# Patient Record
Sex: Female | Born: 1950 | Race: White | Hispanic: No | Marital: Married | Smoking: Never smoker
Health system: Southern US, Community
[De-identification: ages and names within clinical notes are randomized; demographics above are authoritative.]

## PROBLEM LIST (undated history)

## (undated) DIAGNOSIS — E079 Disorder of thyroid, unspecified: Secondary | ICD-10-CM

## (undated) DIAGNOSIS — F419 Anxiety disorder, unspecified: Secondary | ICD-10-CM

## (undated) DIAGNOSIS — M1990 Unspecified osteoarthritis, unspecified site: Secondary | ICD-10-CM

## (undated) DIAGNOSIS — M5430 Sciatica, unspecified side: Secondary | ICD-10-CM

## (undated) HISTORY — PX: TONSILLECTOMY: SUR1361

---

## 2011-08-05 ENCOUNTER — Encounter (HOSPITAL_COMMUNITY): Payer: Self-pay | Admitting: *Deleted

## 2011-08-05 ENCOUNTER — Emergency Department (HOSPITAL_COMMUNITY)
Admission: EM | Admit: 2011-08-05 | Discharge: 2011-08-05 | Disposition: A | Discharge status: Home Health | Payer: BC Managed Care – PPO | Attending: Emergency Medicine | Admitting: Emergency Medicine

## 2011-08-05 ENCOUNTER — Emergency Department (HOSPITAL_COMMUNITY): Payer: BC Managed Care – PPO

## 2011-08-05 DIAGNOSIS — M129 Arthropathy, unspecified: Secondary | ICD-10-CM | POA: Insufficient documentation

## 2011-08-05 DIAGNOSIS — K5289 Other specified noninfective gastroenteritis and colitis: Secondary | ICD-10-CM | POA: Insufficient documentation

## 2011-08-05 DIAGNOSIS — K529 Noninfective gastroenteritis and colitis, unspecified: Secondary | ICD-10-CM

## 2011-08-05 DIAGNOSIS — R1084 Generalized abdominal pain: Secondary | ICD-10-CM | POA: Insufficient documentation

## 2011-08-05 DIAGNOSIS — R6883 Chills (without fever): Secondary | ICD-10-CM | POA: Insufficient documentation

## 2011-08-05 DIAGNOSIS — Z79899 Other long term (current) drug therapy: Secondary | ICD-10-CM | POA: Insufficient documentation

## 2011-08-05 DIAGNOSIS — R112 Nausea with vomiting, unspecified: Secondary | ICD-10-CM | POA: Insufficient documentation

## 2011-08-05 DIAGNOSIS — R197 Diarrhea, unspecified: Secondary | ICD-10-CM | POA: Insufficient documentation

## 2011-08-05 HISTORY — DX: Sciatica, unspecified side: M54.30

## 2011-08-05 HISTORY — DX: Unspecified osteoarthritis, unspecified site: M19.90

## 2011-08-05 HISTORY — DX: Disorder of thyroid, unspecified: E07.9

## 2011-08-05 HISTORY — DX: Anxiety disorder, unspecified: F41.9

## 2011-08-05 LAB — LIPASE, BLOOD: Lipase: 20 U/L (ref 11–59)

## 2011-08-05 LAB — DIFFERENTIAL
Basophils Absolute: 0 10*3/uL (ref 0.0–0.1)
Basophils Relative: 0 % (ref 0–1)
Eosinophils Absolute: 0 10*3/uL (ref 0.0–0.7)
Eosinophils Relative: 0 % (ref 0–5)
Monocytes Absolute: 0.2 10*3/uL (ref 0.1–1.0)

## 2011-08-05 LAB — URINALYSIS, ROUTINE W REFLEX MICROSCOPIC
Glucose, UA: NEGATIVE mg/dL
Ketones, ur: NEGATIVE mg/dL
Leukocytes, UA: NEGATIVE
Protein, ur: NEGATIVE mg/dL
Urobilinogen, UA: 0.2 mg/dL (ref 0.0–1.0)

## 2011-08-05 LAB — HEPATIC FUNCTION PANEL
ALT: 65 U/L — ABNORMAL HIGH (ref 0–35)
Bilirubin, Direct: 0.2 mg/dL (ref 0.0–0.3)
Indirect Bilirubin: 0.6 mg/dL (ref 0.3–0.9)

## 2011-08-05 LAB — BASIC METABOLIC PANEL
CO2: 24 mEq/L (ref 19–32)
Calcium: 8.9 mg/dL (ref 8.4–10.5)
Creatinine, Ser: 0.95 mg/dL (ref 0.50–1.10)
GFR calc Af Amer: 75 mL/min — ABNORMAL LOW (ref >90)
GFR calc non Af Amer: 64 mL/min — ABNORMAL LOW (ref >90)
Sodium: 138 mEq/L (ref 135–145)

## 2011-08-05 LAB — CBC
HCT: 42.2 % (ref 36.0–46.0)
MCH: 28.2 pg (ref 26.0–34.0)
MCHC: 34.1 g/dL (ref 30.0–36.0)
MCV: 82.7 fL (ref 78.0–100.0)
Platelets: 121 10*3/uL — ABNORMAL LOW (ref 150–400)
RDW: 14.9 % (ref 11.5–15.5)
WBC: 6.7 10*3/uL (ref 4.0–10.5)

## 2011-08-05 MED ORDER — ONDANSETRON HCL 4 MG/2ML IJ SOLN
4.0000 mg | Freq: Once | INTRAMUSCULAR | Status: AC
  Administered 2011-08-05: 4 mg via INTRAVENOUS
  Filled 2011-08-05: qty 2

## 2011-08-05 MED ORDER — HYDROMORPHONE HCL PF 1 MG/ML IJ SOLN
1.0000 mg | Freq: Once | INTRAMUSCULAR | Status: AC
  Administered 2011-08-05: 1 mg via INTRAVENOUS
  Filled 2011-08-05: qty 1

## 2011-08-05 MED ORDER — LOPERAMIDE HCL 2 MG PO CAPS: 2.0000 mg | ORAL_CAPSULE | Freq: Four times a day (QID) | ORAL | Status: AC | PRN

## 2011-08-05 MED ORDER — PROMETHAZINE HCL 25 MG PO TABS: 25.0000 mg | ORAL_TABLET | Freq: Four times a day (QID) | ORAL | Status: AC | PRN

## 2011-08-05 MED ORDER — SODIUM CHLORIDE 0.9 % IV SOLN
Freq: Once | INTRAVENOUS | Status: AC
  Administered 2011-08-05: 10:00:00 via INTRAVENOUS

## 2011-08-05 MED ORDER — IOHEXOL 300 MG/ML  SOLN
100.0000 mL | Freq: Once | INTRAMUSCULAR | Status: AC | PRN
  Administered 2011-08-05: 100 mL via INTRAVENOUS

## 2011-08-05 MED ORDER — ONDANSETRON 8 MG PO TBDP: 8.0000 mg | ORAL_TABLET | Freq: Three times a day (TID) | ORAL | Status: AC | PRN

## 2011-08-05 MED ORDER — SODIUM CHLORIDE 0.9 % IV BOLUS (SEPSIS)
1000.0000 mL | Freq: Once | INTRAVENOUS | Status: AC
  Administered 2011-08-05: 1000 mL via INTRAVENOUS

## 2011-08-05 NOTE — ED Provider Notes (Signed)
History    Scribed for Shelda Jakes, MD, the patient was seen in room APA04/APA04. This chart was scribed by Katha Cabal.   CSN: [id number]  Arrival date & time 08/05/11  6962   First MD Initiated Contact with Patient 08/05/11 [phone]      Chief Complaint  Patient presents with  . Abdominal Pain  . Diarrhea    (Consider location/radiation/quality/duration/timing/severity/associated sxs/prior treatment) HPI  Pt was seen at 9:38 AM   Angelica Wong is a 61 y.o. female who presents to the Emergency Department complaining of constant generalized abdominal pain that began yesterday.  Pain is described as cramping.  Pain rated 6/10 currently.   Symptoms are associated with chills, nausea and vomiting and malodorous diarrhea.  No blood in diarrhea or vomit.  There has been no change in symptoms.  Patient reports sick contacts at her job.  Denies new swelling in legs or back pain, dysuria, sore throat, chest pain, shortness of breath or rash.     PCP Dr. Malena Peer [place], [place])      Past Medical History  Diagnosis Date  . Thyroid disease   . Arthritis   . Sciatica   . Anxiety     Past Surgical History  Procedure Date  . Cesarean section   . Tonsillectomy     No family history on file.  History  Substance Use Topics  . Smoking status: Never Smoker   . Smokeless tobacco: Not on file  . Alcohol Use: No    OB History    Grav Para Term Preterm Abortions TAB SAB Ect Mult Living                  Review of Systems  All other systems reviewed and are negative.   Remaining review of systems negative except as noted in the HPI.   Allergies  Wellbutrin  Home Medications   Current Outpatient Rx  Name Route Sig Dispense Refill  . ALPRAZOLAM 1 MG PO TABS Oral Take 0.5 mg by mouth daily as needed. Sleep    . VITAMIN B-12 PO Oral Take 1 tablet by mouth daily.    . DULOXETINE HCL 60 MG PO CPEP Oral Take 60 mg by mouth daily.    Marland Kitchen LEVOTHYROXINE SODIUM 137  MCG PO TABS Oral Take 137 mcg by mouth daily.    . ADULT MULTIVITAMIN W/MINERALS CH Oral Take 1 tablet by mouth daily.    Marland Kitchen VITAMIN D (ERGOCALCIFEROL) 50000 UNITS PO CAPS Oral Take 50,000 Units by mouth every 7 (seven) days. Fridays    . LOPERAMIDE HCL 2 MG PO CAPS Oral Take 1 capsule (2 mg total) by mouth 4 (four) times daily as needed for diarrhea or loose stools. 12 capsule 0  . ONDANSETRON 8 MG PO TBDP Oral Take 1 tablet (8 mg total) by mouth every 8 (eight) hours as needed for nausea. 20 tablet 0  . PROMETHAZINE HCL 25 MG PO TABS Oral Take 1 tablet (25 mg total) by mouth every 6 (six) hours as needed for nausea. 12 tablet 0    BP 120/60  Pulse 94  Temp(Src) 99.3 F (37.4 C) (Oral)  Resp 20  Ht 5\' 5"  (1.651 m)  Wt 270 lb (122.471 kg)  BMI 44.93 kg/m2  SpO2 97%  Physical Exam  Nursing note and vitals reviewed. Constitutional: She is oriented to person, place, and time. She appears well-developed.  HENT:  Head: Normocephalic and atraumatic.  Mouth/Throat: Oropharynx is clear and moist and  mucous membranes are normal. No posterior oropharyngeal erythema.  Eyes: Conjunctivae and EOM are normal.  Neck: Neck supple.  Cardiovascular: Normal rate, regular rhythm and normal heart sounds.   Pulmonary/Chest: Effort normal and breath sounds normal. No respiratory distress. She has no wheezes.  Abdominal: Soft. Bowel sounds are normal. There is no tenderness. There is no rebound and no guarding.  Musculoskeletal: Normal range of motion. She exhibits no edema.  Lymphadenopathy:    She has no cervical adenopathy.  Neurological: She is alert and oriented to person, place, and time. No sensory deficit.  Skin: Skin is warm and dry. No rash noted.  Psychiatric: She has a normal mood and affect. Her behavior is normal.    ED Course  Procedures (including critical care time)   DIAGNOSTIC STUDIES: Oxygen Saturation is 100% on room air, normal by my interpretation.     COORDINATION OF  CARE:   9:23 AM  Labs and UA.     9:30 AM  Zofran and IV fluids.    9:41 AM  Physical exam complete.  Will order labs and hepatic function panel.     9:51 AM  RN reported to EDMD that patient requested something for pain.  Pain control.  10:00 AM  Dilaudid ordered.  11:00 AM Zofran ordered.  2:12 PM  Advised patient of radiological and lab findings.  Plan to discharge patient.  Patient agrees with plan.    LABS / RADIOLOGY:   Labs Reviewed  CBC - Abnormal; Notable for the following:    Platelets 121 (*)    All other components within normal limits  BASIC METABOLIC PANEL - Abnormal; Notable for the following:    Potassium 3.3 (*)    GFR calc non Af Amer 64 (*)    GFR calc Af Amer 75 (*)    All other components within normal limits  HEPATIC FUNCTION PANEL - Abnormal; Notable for the following:    Albumin 3.4 (*)    AST 70 (*)    ALT 65 (*)    Alkaline Phosphatase 159 (*)    All other components within normal limits  DIFFERENTIAL  URINALYSIS, ROUTINE W REFLEX MICROSCOPIC  LIPASE, BLOOD  LIPASE, BLOOD  HEPATIC FUNCTION PANEL   Results for orders placed during the hospital encounter of 08/05/11  CBC      Component Value Range   WBC 6.7  4.0 - 10.5 (K/uL)   RBC 5.10  3.87 - 5.11 (MIL/uL)   Hemoglobin 14.4  12.0 - 15.0 (g/dL)   HCT 09.8  11.9 - 14.7 (%)   MCV 82.7  78.0 - 100.0 (fL)   MCH 28.2  26.0 - 34.0 (pg)   MCHC 34.1  30.0 - 36.0 (g/dL)   RDW 82.9  56.2 - 13.0 (%)   Platelets 121 (*) 150 - 400 (K/uL)  DIFFERENTIAL      Component Value Range   Neutrophils Relative 76  43 - 77 (%)   Neutro Abs 5.1  1.7 - 7.7 (K/uL)   Lymphocytes Relative 20  12 - 46 (%)   Lymphs Abs 1.3  0.7 - 4.0 (K/uL)   Monocytes Relative 3  3 - 12 (%)   Monocytes Absolute 0.2  0.1 - 1.0 (K/uL)   Eosinophils Relative 0  0 - 5 (%)   Eosinophils Absolute 0.0  0.0 - 0.7 (K/uL)   Basophils Relative 0  0 - 1 (%)   Basophils Absolute 0.0  0.0 - 0.1 (K/uL)  BASIC METABOLIC PANEL  Component Value  Range   Sodium 138  135 - 145 (mEq/L)   Potassium 3.3 (*) 3.5 - 5.1 (mEq/L)   Chloride 104  96 - 112 (mEq/L)   CO2 24  19 - 32 (mEq/L)   Glucose, Bld 96  70 - 99 (mg/dL)   BUN 14  6 - 23 (mg/dL)   Creatinine, Ser 1.61  0.50 - 1.10 (mg/dL)   Calcium 8.9  8.4 - 09.6 (mg/dL)   GFR calc non Af Amer 64 (*) >90 (mL/min)   GFR calc Af Amer 75 (*) >90 (mL/min)  URINALYSIS, ROUTINE W REFLEX MICROSCOPIC      Component Value Range   Color, Urine YELLOW  YELLOW    APPearance CLEAR  CLEAR    Specific Gravity, Urine 1.025  1.005 - 1.030    pH 6.0  5.0 - 8.0    Glucose, UA NEGATIVE  NEGATIVE (mg/dL)   Hgb urine dipstick NEGATIVE  NEGATIVE    Bilirubin Urine NEGATIVE  NEGATIVE    Ketones, ur NEGATIVE  NEGATIVE (mg/dL)   Protein, ur NEGATIVE  NEGATIVE (mg/dL)   Urobilinogen, UA 0.2  0.0 - 1.0 (mg/dL)   Nitrite NEGATIVE  NEGATIVE    Leukocytes, UA NEGATIVE  NEGATIVE   HEPATIC FUNCTION PANEL      Component Value Range   Total Protein 7.2  6.0 - 8.3 (g/dL)   Albumin 3.4 (*) 3.5 - 5.2 (g/dL)   AST 70 (*) 0 - 37 (U/L)   ALT 65 (*) 0 - 35 (U/L)   Alkaline Phosphatase 159 (*) 39 - 117 (U/L)   Total Bilirubin 0.8  0.3 - 1.2 (mg/dL)   Bilirubin, Direct 0.2  0.0 - 0.3 (mg/dL)   Indirect Bilirubin 0.6  0.3 - 0.9 (mg/dL)  LIPASE, BLOOD      Component Value Range   Lipase 20  11 - 59 (U/L)    Ct Abdomen Pelvis W Contrast  08/05/2011  *RADIOLOGY REPORT*  Clinical Data: Abdominal pain and diarrhea.  CT ABDOMEN AND PELVIS WITH CONTRAST  Technique:  Multidetector CT imaging of the abdomen and pelvis was performed following the standard protocol during bolus administration of intravenous contrast.  Contrast: OMNIPAQUE IOHEXOL 300 MG/ML IJ SOLN  Comparison: None.  Findings: Lung bases are clear.  Heart size normal.  Small pre pericardiac lymph nodes.  No pericardial or pleural effusion.  Liver is unremarkable.  A 2.1 cm stone is seen in the gallbladder. Adrenal glands are unremarkable.  Tiny stone in the  right kidney. No obstruction.  Low attenuation lesions in the kidneys measure up to 2.4 cm on the right and are likely cysts.  Spleen measures 13.5 cm.  Pancreas, stomach and small bowel are unremarkable.  Fluid attenuation is seen in the majority of the colon.  Uterus and ovaries are visualized.  No pathologically enlarged lymph nodes.  No free fluid.  Small amount of air seen in the bladder is presumably iatrogenic.  Mild mesenteric haziness and sub centimeter nodularity, nonspecific.  No worrisome lytic or sclerotic lesions.  IMPRESSION:  1.  Fluid attenuation within the colon is consistent with the given history of diarrhea. 2.  Cholelithiasis. 3.  Tiny right renal stone. 4.  Borderline splenic enlargement. 5.  Air in the bladder is presumably iatrogenic in etiology.  Original Report Authenticated By: Reyes Ivan, M.D.         MDM  Clinically symptoms consistent with viral gastroenteritis CT scan was ordered because of diffuse abdominal pain and  cramping however CT without any specific findings. Patient improved in the emergency department no further vomiting or diarrhea is persisting. Abdomen is now soft nontender. Patient overall feeling better.       MEDICATIONS GIVEN IN THE E.D. Scheduled Meds:    . sodium chloride   Intravenous Once  . HYDROmorphone  1 mg Intravenous Once  . ondansetron  4 mg Intravenous Once  . ondansetron  4 mg Intravenous Once  . sodium chloride  1,000 mL Intravenous Once   Continuous Infusions:      IMPRESSION: 1. Gastroenteritis      NEW MEDICATIONS: New Prescriptions   LOPERAMIDE (IMODIUM) 2 MG CAPSULE    Take 1 capsule (2 mg total) by mouth 4 (four) times daily as needed for diarrhea or loose stools.   ONDANSETRON (ZOFRAN ODT) 8 MG DISINTEGRATING TABLET    Take 1 tablet (8 mg total) by mouth every 8 (eight) hours as needed for nausea.   PROMETHAZINE (PHENERGAN) 25 MG TABLET    Take 1 tablet (25 mg total) by mouth every 6 (six) hours as  needed for nausea.      I personally performed the services described in this documentation, which was scribed in my presence. The recorded information has been reviewed and considered.        Shelda Jakes, MD 08/05/11 [phone]

## 2011-08-05 NOTE — Discharge Instructions (Signed)
B.R.A.T. Diet Your doctor has recommended the B.R.A.T. diet for you or your child until the condition improves. This is often used to help control diarrhea and vomiting symptoms. If you or your child can tolerate clear liquids, you may have:  Bananas.   Rice.   Applesauce.   Toast (and other simple starches such as crackers, potatoes, noodles).  Be sure to avoid dairy products, meats, and fatty foods until symptoms are better. Fruit juices such as apple, grape, and prune juice can make diarrhea worse. Avoid these. Continue this diet for 2 days or as instructed by your caregiver. Document Released: 05/17/2005 Document Revised: 05/06/2011 Document Reviewed: 11/03/2006 [facility] Patient Information 2012 [place], [place].  Take medicines as directed return for new or worse symptoms or for persistent vomiting and diarrhea or worse abdominal pain.

## 2011-08-05 NOTE — ED Notes (Signed)
Pt states abdominal pain, nausea and vomiting since yesterday. Last vomited at 0030.

## 2016-06-23 ENCOUNTER — Other Ambulatory Visit (HOSPITAL_COMMUNITY): Payer: Self-pay | Admitting: Neurological Surgery

## 2016-06-23 DIAGNOSIS — M5412 Radiculopathy, cervical region: Secondary | ICD-10-CM

## 2016-06-28 ENCOUNTER — Encounter (HOSPITAL_COMMUNITY): Payer: Self-pay

## 2016-06-28 ENCOUNTER — Ambulatory Visit (HOSPITAL_COMMUNITY): Payer: Medicare HMO

## 2016-07-12 ENCOUNTER — Ambulatory Visit (HOSPITAL_COMMUNITY)
Admission: RE | Admit: 2016-07-12 | Discharge: 2016-07-12 | Disposition: A | Discharge status: Home / Self Care | Payer: Medicare HMO | Source: Ambulatory Visit | Attending: Neurological Surgery | Admitting: Neurological Surgery

## 2016-07-12 DIAGNOSIS — M4802 Spinal stenosis, cervical region: Secondary | ICD-10-CM | POA: Diagnosis not present

## 2016-07-12 DIAGNOSIS — M50321 Other cervical disc degeneration at C4-C5 level: Secondary | ICD-10-CM | POA: Insufficient documentation

## 2016-07-12 DIAGNOSIS — M5412 Radiculopathy, cervical region: Secondary | ICD-10-CM | POA: Diagnosis present

## 2017-06-21 ENCOUNTER — Emergency Department (HOSPITAL_COMMUNITY): Payer: Medicare Other

## 2017-06-21 ENCOUNTER — Encounter (HOSPITAL_COMMUNITY): Payer: Self-pay | Admitting: Emergency Medicine

## 2017-06-21 ENCOUNTER — Other Ambulatory Visit: Payer: Self-pay

## 2017-06-21 ENCOUNTER — Emergency Department (HOSPITAL_COMMUNITY)
Admission: EM | Admit: 2017-06-21 | Discharge: 2017-06-21 | Disposition: A | Discharge status: Home / Self Care | Payer: Medicare Other | Attending: Emergency Medicine | Admitting: Emergency Medicine

## 2017-06-21 DIAGNOSIS — K5900 Constipation, unspecified: Secondary | ICD-10-CM | POA: Diagnosis present

## 2017-06-21 DIAGNOSIS — R140 Abdominal distension (gaseous): Secondary | ICD-10-CM | POA: Diagnosis not present

## 2017-06-21 DIAGNOSIS — Z79899 Other long term (current) drug therapy: Secondary | ICD-10-CM | POA: Diagnosis not present

## 2017-06-21 MED ORDER — SIMETHICONE 80 MG PO CHEW: 80.0000 mg | CHEWABLE_TABLET | Freq: Four times a day (QID) | ORAL | 0 refills | Status: AC | PRN

## 2017-06-21 NOTE — ED Provider Notes (Signed)
[place] [facility]   CSN: [id number] Arrival date & time: 06/21/17  [id number]     History   Chief Complaint Chief Complaint  Patient presents with  . Constipation    HPI Angelica Wong is a 67 y.o. female.  HPI She presents with 2 weeks of decreased bowel movements and abdominal distention.  She is been using over-the-counter medication for presumed constipation.  States she is passing gas.  No nausea or vomiting.  No fever or chills.  Denies any urinary symptoms.  Complains of dry mouth.  States she has had problems with constipation on and off for most of her life.  No recent dietary changes. Past Medical History:  Diagnosis Date  . Anxiety   . Arthritis   . Sciatica   . Thyroid disease     There are no active problems to display for this patient.   Past Surgical History:  Procedure Laterality Date  . CESAREAN SECTION    . TONSILLECTOMY      OB History    Gravida Para Term Preterm AB Living   3             SAB TAB Ectopic Multiple Live Births                   Home Medications    Prior to Admission medications   Medication Sig Start Date End Date Taking? Authorizing Provider  cyclobenzaprine (FLEXERIL) 5 MG tablet Take 5 mg by mouth 2 (two) times daily.   Yes [provider]  DULoxetine (CYMBALTA) 60 MG capsule Take 60 mg by mouth daily.   Yes [provider]  folic acid (FOLVITE) 1 MG tablet Take 1 mg by mouth daily.   Yes [provider]  Levothyroxine Sodium 112 MCG CAPS Take 112 mcg by mouth daily.    Yes [provider]  LORazepam (ATIVAN) 0.5 MG tablet Take 0.5 mg by mouth 2 (two) times daily. 2 tablets at bedtime   Yes [provider]  traMADol (ULTRAM) 50 MG tablet Take by mouth every 12 (twelve) hours as needed.   Yes [provider]  Vitamin D, Ergocalciferol, (DRISDOL) 50000 UNITS CAPS Take 50,000 Units by mouth every 7 (seven) days. Fridays   Yes [provider]  simethicone (GAS-X) 80 MG chewable tablet Chew 1 tablet (80 mg total) by mouth every 6 (six) hours as needed for flatulence. 06/21/17   Loren RacerYelverton, Tishina Lown, MD    Family History History reviewed. No pertinent family history.  Social History Social History   Tobacco Use  . Smoking status: Never Smoker  . Smokeless tobacco: Never Used  Substance Use Topics  . Alcohol use: No  . Drug use: Not on file     Allergies   Wellbutrin [bupropion hcl]   Review of Systems Review of Systems  Constitutional: Negative for chills and fever.  Respiratory: Negative for cough and shortness of breath.   Cardiovascular: Negative for chest pain.  Gastrointestinal: Positive for abdominal distention and constipation. Negative for abdominal pain, diarrhea, nausea and vomiting.  Genitourinary: Negative for dysuria, flank pain and frequency.  Musculoskeletal: Negative for back pain, myalgias and neck pain.  Skin: Negative for rash and wound.  Neurological: Negative for dizziness, weakness, light-headedness, numbness and headaches.  All other systems reviewed and are negative.    Physical Exam Updated Vital Signs BP 135/72   Pulse 69   Temp 98.4 F (36.9 C) (Oral)   Resp 16  Ht 5\' 5"  (1.651 m)   Wt 113.4 kg (250 lb)   SpO2 100%   BMI 41.60 kg/m   Physical Exam  Constitutional: She is oriented to person, place, and time. She appears well-developed and well-nourished.  HENT:  Head: Normocephalic and atraumatic.  Mouth/Throat: Oropharynx is clear and moist.  Eyes: EOM are normal. Pupils are equal, round, and reactive to light.  Neck: Normal range of motion. Neck supple.  Cardiovascular: Normal rate and regular rhythm.  Pulmonary/Chest: Effort normal and breath sounds normal.  Abdominal: Soft. Bowel sounds are normal. She exhibits distension. She exhibits no mass. There is no tenderness. There is no rebound and no guarding. No hernia.  Question mild abdominal distention.  Normal bowel  sounds.  No tenderness to palpation.  Musculoskeletal: Normal range of motion. She exhibits no edema or tenderness.  No CVA tenderness bilaterally.  No lower extremity swelling or pain.  Distal pulses are 2+.  Neurological: She is alert and oriented to person, place, and time.  Skin: Skin is warm and dry. No rash noted. No erythema.  Psychiatric: She has a normal mood and affect. Her behavior is normal.  Nursing note and vitals reviewed.    ED Treatments / Results  Labs (all labs ordered are listed, but only abnormal results are displayed) Labs Reviewed - No data to display  EKG  EKG Interpretation None       Radiology Dg Abd Acute W/chest  Result Date: 06/21/2017 CLINICAL DATA:  Abdominal pain, no bowel movements for 2 weeks EXAM: DG ABDOMEN ACUTE W/ 1V CHEST COMPARISON:  08/05/2011 CT of the abdomen FINDINGS: Cardiac shadow is within normal limits. Aortic calcifications are seen. The lungs are well aerated bilaterally. No focal infiltrate or sizable effusion is noted. Lamellated gallstone is noted in the right upper quadrant. Scattered large and small bowel gas is seen. No free air is noted. Degenerative changes of lumbar spine are seen. No other focal abnormality is noted. IMPRESSION: Cholelithiasis without definitive complicating factors. No acute abnormality noted. Electronically Signed   By: Alcide Clever M.D.   On: 06/21/2017 10:21    Procedures Procedures (including critical care time)  Medications Ordered in ED Medications - No data to display   Initial Impression / Assessment and Plan / ED Course  I have reviewed the triage vital signs and the nursing notes.  Pertinent labs & imaging results that were available during my care of the patient were reviewed by me and considered in my medical decision making (see chart for details).     X-ray with scattered air.  Does not appear to have increased stool burden.  Abdominal exam is benign.  Do not believe that further  workup is needed at this time.  Will start on simethicone on and have advised gastroneurology follow-up.  Strict return precautions have been given.  Final Clinical Impressions(s) / ED Diagnoses   Final diagnoses:  Gaseous abdominal distention    ED Discharge Orders        Ordered    simethicone (GAS-X) 80 MG chewable tablet  Every 6 hours PRN     06/21/17 1153       Loren Racer, MD 06/21/17 1200

## 2017-06-21 NOTE — ED Triage Notes (Signed)
Last normal about 2 weeks ago.  Tried OTC treatment with little results.  Fleets enema 2 days ago with little results.  Pt says she may have thrush in mouth also.  Having lower back pain, rating pain 5/10.

## 2020-04-07 ENCOUNTER — Encounter (HOSPITAL_COMMUNITY): Payer: Self-pay

## 2020-04-07 ENCOUNTER — Emergency Department (HOSPITAL_COMMUNITY)
Admission: EM | Admit: 2020-04-07 | Discharge: 2020-04-07 | Disposition: A | Discharge status: Home / Self Care | Payer: Medicare Other | Attending: Emergency Medicine | Admitting: Emergency Medicine

## 2020-04-07 ENCOUNTER — Emergency Department (HOSPITAL_COMMUNITY): Payer: Medicare Other

## 2020-04-07 ENCOUNTER — Other Ambulatory Visit: Payer: Self-pay

## 2020-04-07 DIAGNOSIS — Z79899 Other long term (current) drug therapy: Secondary | ICD-10-CM | POA: Insufficient documentation

## 2020-04-07 DIAGNOSIS — R059 Cough, unspecified: Secondary | ICD-10-CM | POA: Diagnosis present

## 2020-04-07 DIAGNOSIS — U071 COVID-19: Secondary | ICD-10-CM

## 2020-04-07 NOTE — ED Triage Notes (Signed)
Pt reports tested positive for covid Saturday.  C/O sob, cough, chest pain with coughing, chills, abd pain, and diarrhea.

## 2020-04-07 NOTE — Discharge Instructions (Signed)
The monoclonal antibody clinic is aware that you wish for monoclonal antibody infusion.  They should call you in the next 1 to 2 days to set up the infusion.  Today your oxygen was good in the emergency room.  If you get short of breath, your symptoms change or worsen or you have any new concerns please seek additional medical care and evaluation.  I would recommend getting a pulse oximeter to check your oxygen at home.  If your oxygen is consistently below 92% or you have other concerns please seek additional medical care and evaluation.  Please take Tylenol (acetaminophen) to relieve your pain.  You may take tylenol, up to 1,000 mg (two extra strength pills).  Do not take more than 3,000 mg tylenol in a 24 hour period.  Please check all medication labels as many medications such as pain and cold medications may contain tylenol. Please do not drink alcohol while taking this medication.

## 2020-04-07 NOTE — ED Provider Notes (Signed)
[facility] EMERGENCY DEPARTMENT Provider Note   CSN: [id number] Arrival date & time: 04/07/20  1706     History Chief Complaint  Patient presents with  . covid  . Shortness of Breath    Angelica Wong is a 69 y.o. female with past medical history of anxiety, thyroid disease, BMI over 41, who presents today for evaluation of cough.  She is on day 5 of COVID-19 symptoms.  She reports that her primary concern is the cough which she is able to provoke when she takes a full deep breath.  She denies any significant shortness of breath.  She has not been vaccinated.  She wishes for monoclonal antibody infusion.  She states that her husband is sick with similar.  She had a positive test at a local drugstore.  She denies any leg swelling.  No vomiting.  Does report occasional diarrhea.  HPI     Past Medical History:  Diagnosis Date  . Anxiety   . Arthritis   . Sciatica   . Thyroid disease     There are no problems to display for this patient.   Past Surgical History:  Procedure Laterality Date  . CESAREAN SECTION    . TONSILLECTOMY       OB History    Gravida  3   Para      Term      Preterm      AB      Living        SAB      TAB      Ectopic      Multiple      Live Births              No family history on file.  Social History   Tobacco Use  . Smoking status: Never Smoker  . Smokeless tobacco: Never Used  Substance Use Topics  . Alcohol use: No  . Drug use: Not on file    Home Medications Prior to Admission medications   Medication Sig Start Date End Date Taking? Authorizing Provider  albuterol (VENTOLIN HFA) 108 (90 Base) MCG/ACT inhaler  02/16/20  Yes [provider]  allopurinol (ZYLOPRIM) 100 MG tablet Take 100 mg by mouth daily. 10/31/19  Yes [provider]  ascorbic acid (VITAMIN C) 1000 MG tablet Take by mouth. 11/09/17  Yes [provider]  cyclobenzaprine (FLEXERIL) 5 MG tablet Take 5 mg by mouth 2 (two)  times daily.   Yes [provider]  DULoxetine (CYMBALTA) 60 MG capsule Take 60 mg by mouth daily.   Yes [provider]  folic acid (FOLVITE) 1 MG tablet Take 1 mg by mouth daily.   Yes [provider]  Levothyroxine Sodium 112 MCG CAPS Take 112 mcg by mouth daily.    Yes [provider]  LORazepam (ATIVAN) 0.5 MG tablet Take 0.5 mg by mouth 2 (two) times daily. 2 tablets at bedtime   Yes [provider]  montelukast (SINGULAIR) 10 MG tablet Take 10 mg by mouth daily. 01/29/20  Yes [provider]  Multiple Vitamin (MULTIVITAMIN) tablet Take 1 tablet by mouth daily.   Yes [provider]  simethicone (GAS-X) 80 MG chewable tablet Chew 1 tablet (80 mg total) by mouth every 6 (six) hours as needed for flatulence. 06/21/17  Yes Loren Racer, MD  traMADol (ULTRAM) 50 MG tablet Take by mouth every 12 (twelve) hours as needed.   Yes [provider]  Vitamin D, Ergocalciferol, (  DRISDOL) 50000 UNITS CAPS Take 50,000 Units by mouth every 7 (seven) days. Fridays   Yes [provider]  azithromycin (ZITHROMAX) 250 MG tablet Take 250 mg by mouth as directed. Patient not taking: Reported on 04/07/2020 03/13/20   [provider]  metoprolol succinate (TOPROL-XL) 25 MG 24 hr tablet Take 25 mg by mouth daily. 01/22/20   [provider]  predniSONE (DELTASONE) 20 MG tablet Take 20 mg by mouth 2 (two) times daily. Patient not taking: Reported on 04/07/2020 03/13/20   [provider]    Allergies    Wellbutrin [bupropion hcl]  Review of Systems   Review of Systems  Constitutional: Positive for chills, fatigue and fever.  Respiratory: Positive for cough. Negative for shortness of breath.   Cardiovascular: Positive for chest pain.  Gastrointestinal: Positive for diarrhea. Negative for abdominal pain and vomiting.  Musculoskeletal: Positive for arthralgias and myalgias.  Skin: Negative for color change  and rash.  Neurological: Negative for dizziness, seizures, weakness and numbness.  All other systems reviewed and are negative.   Physical Exam Updated Vital Signs BP 105/63 (BP Location: Left Arm)   Pulse 76   Temp 99.3 F (37.4 C) (Oral)   Resp 20   Ht 5\' 4"  (1.626 m)   Wt 122.5 kg   SpO2 97%   BMI 46.35 kg/m   Physical Exam Vitals and nursing note reviewed.  Constitutional:      General: She is not in acute distress.    Appearance: She is obese. She is not ill-appearing.  HENT:     Head: Normocephalic.  Eyes:     Conjunctiva/sclera: Conjunctivae normal.  Cardiovascular:     Rate and Rhythm: Normal rate and regular rhythm.  Pulmonary:     Effort: Pulmonary effort is normal. No tachypnea, accessory muscle usage or respiratory distress.     Breath sounds: No decreased breath sounds.  Abdominal:     Palpations: Abdomen is soft.     Tenderness: There is no abdominal tenderness.  Musculoskeletal:     Cervical back: Normal range of motion and neck supple.     Comments: Able to stand and walk in place with out difficulty.   Skin:    General: Skin is warm.  Neurological:     General: No focal deficit present.     Mental Status: She is alert.     Comments: Awake and alert, answers all questions appropriately.  Speech is not slurred.  Psychiatric:        Mood and Affect: Mood normal.        Behavior: Behavior normal.     ED Results / Procedures / Treatments   Labs (all labs ordered are listed, but only abnormal results are displayed) Labs Reviewed - No data to display  EKG EKG Interpretation  Date/Time:  Monday April 07 2020 17:56:13 EST Ventricular Rate:  70 PR Interval:  166 QRS Duration: 80 QT Interval:  390 QTC Calculation: 421 R Axis:   17 Text Interpretation: Normal sinus rhythm Inferior infarct , age undetermined Cannot rule out Anterior infarct , age undetermined Abnormal ECG No old tracing to compare Confirmed by 04-28-1985 [phone]) on 04/07/2020  6:16:41 PM   Radiology DG Chest Portable 1 View  Result Date: 04/07/2020 CLINICAL DATA:  Cough shortness of breath COVID positive EXAM: PORTABLE CHEST 1 VIEW COMPARISON:  None. FINDINGS: The heart size and mediastinal contours are within normal limits. Hazy patchy airspace opacity seen within the right mid lung and bilateral  lung bases. Aortic calcifications are seen. The visualized skeletal structures are unremarkable. IMPRESSION: Patchy airspace opacity seen within both lungs which be due to infectious etiology. Electronically Signed   By: Jonna Clark M.D.   On: 04/07/2020 18:46    Procedures Procedures (including critical care time)  Medications Ordered in ED Medications - No data to display  ED Course  I have reviewed the triage vital signs and the nursing notes.  Pertinent labs & imaging results that were available during my care of the patient were reviewed by me and considered in my medical decision making (see chart for details).    MDM Rules/Calculators/A&P                          Kylena Mole is a 69 year old woman who is on day 5 of COVID-19 symptoms.  She presents today primarily concern for cough.  She notes that every time she takes a full deep breath she then coughs.  She did not get vaccinated.  She wishes for monoclonal antibody infusion.  She had a positive test at outside facility.  Chest x-ray today shows patchy airspace opacity bilaterally consistent with COVID-19 viral pneumonia.  I personally ambulated her in the room and she maintained 98% and above on room air without significant shortness of breath.  I contacted the monoclonal antibody infusion group, they are aware that patient has Covid and wishes for infusion and will contact her.  Due to her only being on day 5 of symptoms with adequate time to get this treatment as an outpatient she will be discharged for this. We discussed anticipated course of Covid.  Return precautions were discussed with patient  who states their understanding.  At the time of discharge patient denied any unaddressed complaints or concerns.  Patient is agreeable for discharge home.  Note: Portions of this report may have been transcribed using voice recognition software. Every effort was made to ensure accuracy; however, inadvertent computerized transcription errors may be present   Angelica Wong was evaluated in Emergency Department on 04/07/2020 for the symptoms described in the history of present illness. She was evaluated in the context of the global COVID-19 pandemic, which necessitated consideration that the patient might be at risk for infection with the SARS-CoV-2 virus that causes COVID-19. Institutional protocols and algorithms that pertain to the evaluation of patients at risk for COVID-19 are in a state of rapid change based on information released by regulatory bodies including the CDC and federal and state organizations. These policies and algorithms were followed during the patient's care in the ED.  Final Clinical Impression(s) / ED Diagnoses Final diagnoses:  COVID-19  Cough    Rx / DC Orders ED Discharge Orders    None       Yoselin, Amerman 04/07/20 1956    Mancel Bale, MD 04/08/20 [phone]

## 2020-04-08 ENCOUNTER — Other Ambulatory Visit: Payer: Self-pay | Admitting: Nurse Practitioner

## 2020-04-08 ENCOUNTER — Ambulatory Visit (HOSPITAL_COMMUNITY)
Admission: RE | Admit: 2020-04-08 | Discharge: 2020-04-08 | Disposition: A | Discharge status: Home / Self Care | Payer: Medicare Other | Source: Ambulatory Visit | Attending: Pulmonary Disease | Admitting: Pulmonary Disease

## 2020-04-08 DIAGNOSIS — U071 COVID-19: Secondary | ICD-10-CM | POA: Diagnosis not present

## 2020-04-08 MED ORDER — SODIUM CHLORIDE 0.9 % IV SOLN: INTRAVENOUS | Status: DC | PRN

## 2020-04-08 MED ORDER — EPINEPHRINE 0.3 MG/0.3ML IJ SOAJ: 0.3000 mg | Freq: Once | INTRAMUSCULAR | Status: DC | PRN

## 2020-04-08 MED ORDER — ALBUTEROL SULFATE HFA 108 (90 BASE) MCG/ACT IN AERS: 2.0000 | INHALATION_SPRAY | Freq: Once | RESPIRATORY_TRACT | Status: DC | PRN

## 2020-04-08 MED ORDER — FAMOTIDINE IN NACL 20-0.9 MG/50ML-% IV SOLN: 20.0000 mg | Freq: Once | INTRAVENOUS | Status: DC | PRN

## 2020-04-08 MED ORDER — DIPHENHYDRAMINE HCL 50 MG/ML IJ SOLN: 50.0000 mg | Freq: Once | INTRAMUSCULAR | Status: DC | PRN

## 2020-04-08 MED ORDER — METHYLPREDNISOLONE SODIUM SUCC 125 MG IJ SOLR: 125.0000 mg | Freq: Once | INTRAMUSCULAR | Status: DC | PRN

## 2020-04-08 MED ORDER — SOTROVIMAB 500 MG/8ML IV SOLN
500.0000 mg | Freq: Once | INTRAVENOUS | Status: AC
  Administered 2020-04-08: 500 mg via INTRAVENOUS

## 2020-04-08 NOTE — Discharge Instructions (Signed)
10 Things You Can Do to Manage Your COVID-19 Symptoms at Home °If you have possible or confirmed COVID-19: °1. Stay home from work and school. And stay away from other public places. If you must go out, avoid using any kind of public transportation, ridesharing, or taxis. °2. Monitor your symptoms carefully. If your symptoms get worse, call your healthcare provider immediately. °3. Get rest and stay hydrated. °4. If you have a medical appointment, call the healthcare provider ahead of time and tell them that you have or may have COVID-19. °5. For medical emergencies, call 911 and notify the dispatch personnel that you have or may have COVID-19. °6. Cover your cough and sneezes with a tissue or use the inside of your elbow. °7. Wash your hands often with soap and water for at least 20 seconds or clean your hands with an alcohol-based hand sanitizer that contains at least 60% alcohol. °8. As much as possible, stay in a specific room and away from other people in your home. Also, you should use a separate bathroom, if available. If you need to be around other people in or outside of the home, wear a mask. °9. Avoid sharing personal items with other people in your household, like dishes, towels, and bedding. °10. Clean all surfaces that are touched often, like counters, tabletops, and doorknobs. Use household cleaning sprays or wipes according to the label instructions. °What types of side effects do monoclonal antibody drugs cause?  °Common side effects ° °In general, the more common side effects caused by monoclonal antibody drugs include: °• Allergic reactions, such as hives or itching °• Flu-like signs and symptoms, including chills, fatigue, fever, and muscle aches and pains °• Nausea, vomiting °• Diarrhea °• Skin rashes °• Low blood pressure ° ° °The CDC is recommending patients who receive monoclonal antibody treatments wait at least 90 days before being vaccinated. ° °Currently, there are no data on the safety  and efficacy of mRNA COVID-19 vaccines in persons who received monoclonal antibodies or convalescent plasma as part of COVID-19 treatment. Based on the estimated half-life of such therapies as well as evidence suggesting that reinfection is uncommon in the 90 days after initial infection, vaccination should be deferred for at least 90 days, as a precautionary measure until additional information becomes available, to avoid interference of the antibody treatment with vaccine-induced immune responses. °cdc.gov/coronavirus °11/29/2018 °This information is not intended to replace advice given to you by your health care provider. Make sure you discuss any questions you have with your health care provider. °Document Revised: 05/03/2019 Document Reviewed: 05/03/2019 °Elsevier Patient Education © 2020 Elsevier Inc. ° °

## 2020-04-08 NOTE — Progress Notes (Signed)
I connected by phone with Angelica Wong on 04/08/2020 at 11:32 AM to discuss the potential use of a new treatment for mild to moderate COVID-19 viral infection in non-hospitalized patients.  This patient is a 69 y.o. female that meets the FDA criteria for Emergency Use Authorization of COVID monoclonal antibody casirivimab/imdevimab, bamlanivimab/eteseviamb, or sotrovimab.  Has a (+) direct SARS-CoV-2 viral test result  Has mild or moderate COVID-19   Is NOT hospitalized due to COVID-19  Is within 10 days of symptom onset  Has at least one of the high risk factor(s) for progression to severe COVID-19 and/or hospitalization as defined in EUA.  Specific high risk criteria : BMI > 25   I have spoken and communicated the following to the patient or parent/caregiver regarding COVID monoclonal antibody treatment:  1. FDA has authorized the emergency use for the treatment of mild to moderate COVID-19 in adults and pediatric patients with positive results of direct SARS-CoV-2 viral testing who are 87 years of age and older weighing at least 40 kg, and who are at high risk for progressing to severe COVID-19 and/or hospitalization.  2. The significant known and potential risks and benefits of COVID monoclonal antibody, and the extent to which such potential risks and benefits are unknown.  3. Information on available alternative treatments and the risks and benefits of those alternatives, including clinical trials.  4. Patients treated with COVID monoclonal antibody should continue to self-isolate and use infection control measures (e.g., wear mask, isolate, social distance, avoid sharing personal items, clean and disinfect "high touch" surfaces, and frequent handwashing) according to CDC guidelines.   5. The patient or parent/caregiver has the option to accept or refuse COVID monoclonal antibody treatment.  After reviewing this information with the patient, the patient has agreed to receive one  of the available covid 19 monoclonal antibodies and will be provided an appropriate fact sheet prior to infusion. Angelica Andrew, NP 04/08/2020 11:32 AM

## 2020-04-12 ENCOUNTER — Encounter (HOSPITAL_COMMUNITY): Payer: Self-pay | Admitting: Emergency Medicine

## 2020-04-12 ENCOUNTER — Other Ambulatory Visit: Payer: Self-pay

## 2020-04-12 ENCOUNTER — Inpatient Hospital Stay (HOSPITAL_COMMUNITY)
Admission: EM | Admit: 2020-04-12 | Discharge: 2020-04-24 | DRG: 177 | Disposition: A | Discharge status: Home Health | Payer: Medicare Other | Attending: Internal Medicine | Admitting: Internal Medicine

## 2020-04-12 ENCOUNTER — Emergency Department (HOSPITAL_COMMUNITY): Payer: Medicare Other

## 2020-04-12 ENCOUNTER — Inpatient Hospital Stay (HOSPITAL_COMMUNITY): Payer: Medicare Other

## 2020-04-12 DIAGNOSIS — R739 Hyperglycemia, unspecified: Secondary | ICD-10-CM | POA: Diagnosis not present

## 2020-04-12 DIAGNOSIS — I10 Essential (primary) hypertension: Secondary | ICD-10-CM | POA: Diagnosis present

## 2020-04-12 DIAGNOSIS — R339 Retention of urine, unspecified: Secondary | ICD-10-CM | POA: Diagnosis not present

## 2020-04-12 DIAGNOSIS — D696 Thrombocytopenia, unspecified: Secondary | ICD-10-CM | POA: Diagnosis present

## 2020-04-12 DIAGNOSIS — Z888 Allergy status to other drugs, medicaments and biological substances status: Secondary | ICD-10-CM | POA: Diagnosis not present

## 2020-04-12 DIAGNOSIS — U071 COVID-19: Secondary | ICD-10-CM | POA: Diagnosis present

## 2020-04-12 DIAGNOSIS — Z6841 Body Mass Index (BMI) 40.0 and over, adult: Secondary | ICD-10-CM

## 2020-04-12 DIAGNOSIS — N179 Acute kidney failure, unspecified: Secondary | ICD-10-CM | POA: Diagnosis not present

## 2020-04-12 DIAGNOSIS — Z79890 Hormone replacement therapy (postmenopausal): Secondary | ICD-10-CM

## 2020-04-12 DIAGNOSIS — T380X5A Adverse effect of glucocorticoids and synthetic analogues, initial encounter: Secondary | ICD-10-CM | POA: Diagnosis not present

## 2020-04-12 DIAGNOSIS — Z7189 Other specified counseling: Secondary | ICD-10-CM | POA: Diagnosis not present

## 2020-04-12 DIAGNOSIS — Z515 Encounter for palliative care: Secondary | ICD-10-CM

## 2020-04-12 DIAGNOSIS — R0902 Hypoxemia: Secondary | ICD-10-CM

## 2020-04-12 DIAGNOSIS — R7989 Other specified abnormal findings of blood chemistry: Secondary | ICD-10-CM

## 2020-04-12 DIAGNOSIS — Z79899 Other long term (current) drug therapy: Secondary | ICD-10-CM

## 2020-04-12 DIAGNOSIS — J9601 Acute respiratory failure with hypoxia: Secondary | ICD-10-CM | POA: Diagnosis present

## 2020-04-12 DIAGNOSIS — F32A Depression, unspecified: Secondary | ICD-10-CM | POA: Diagnosis present

## 2020-04-12 DIAGNOSIS — M1990 Unspecified osteoarthritis, unspecified site: Secondary | ICD-10-CM | POA: Diagnosis present

## 2020-04-12 DIAGNOSIS — J189 Pneumonia, unspecified organism: Secondary | ICD-10-CM | POA: Diagnosis present

## 2020-04-12 DIAGNOSIS — J1282 Pneumonia due to coronavirus disease 2019: Secondary | ICD-10-CM | POA: Diagnosis present

## 2020-04-12 DIAGNOSIS — F419 Anxiety disorder, unspecified: Secondary | ICD-10-CM | POA: Diagnosis present

## 2020-04-12 DIAGNOSIS — R0602 Shortness of breath: Secondary | ICD-10-CM | POA: Diagnosis present

## 2020-04-12 DIAGNOSIS — E039 Hypothyroidism, unspecified: Secondary | ICD-10-CM | POA: Diagnosis present

## 2020-04-12 LAB — CBC WITH DIFFERENTIAL/PLATELET
Abs Immature Granulocytes: 0.01 10*3/uL (ref 0.00–0.07)
Basophils Absolute: 0 10*3/uL (ref 0.0–0.1)
Basophils Relative: 0 %
Eosinophils Absolute: 0 10*3/uL (ref 0.0–0.5)
Eosinophils Relative: 0 %
HCT: 41.9 % (ref 36.0–46.0)
Hemoglobin: 13.8 g/dL (ref 12.0–15.0)
Immature Granulocytes: 0 %
Lymphocytes Relative: 25 %
Lymphs Abs: 0.8 10*3/uL (ref 0.7–4.0)
MCH: 29.2 pg (ref 26.0–34.0)
MCHC: 32.9 g/dL (ref 30.0–36.0)
MCV: 88.6 fL (ref 80.0–100.0)
Monocytes Absolute: 0.4 10*3/uL (ref 0.1–1.0)
Monocytes Relative: 12 %
Neutro Abs: 1.9 10*3/uL (ref 1.7–7.7)
Neutrophils Relative %: 63 %
Platelets: 53 10*3/uL — ABNORMAL LOW (ref 150–400)
RBC: 4.73 MIL/uL (ref 3.87–5.11)
RDW: 15.6 % — ABNORMAL HIGH (ref 11.5–15.5)
WBC: 3.1 10*3/uL — ABNORMAL LOW (ref 4.0–10.5)
nRBC: 0 % (ref 0.0–0.2)

## 2020-04-12 LAB — LACTIC ACID, PLASMA
Lactic Acid, Venous: 1.5 mmol/L (ref 0.5–1.9)
Lactic Acid, Venous: 2.6 mmol/L (ref 0.5–1.9)

## 2020-04-12 LAB — COMPREHENSIVE METABOLIC PANEL
ALT: 95 U/L — ABNORMAL HIGH (ref 0–44)
AST: 246 U/L — ABNORMAL HIGH (ref 15–41)
Albumin: 2.9 g/dL — ABNORMAL LOW (ref 3.5–5.0)
Alkaline Phosphatase: 368 U/L — ABNORMAL HIGH (ref 38–126)
Anion gap: 7 (ref 5–15)
BUN: 17 mg/dL (ref 8–23)
CO2: 25 mmol/L (ref 22–32)
Calcium: 7.9 mg/dL — ABNORMAL LOW (ref 8.9–10.3)
Chloride: 105 mmol/L (ref 98–111)
Creatinine, Ser: 1.03 mg/dL — ABNORMAL HIGH (ref 0.44–1.00)
GFR, Estimated: 60 mL/min — ABNORMAL LOW (ref >60)
Glucose, Bld: 107 mg/dL — ABNORMAL HIGH (ref 70–99)
Potassium: 4.3 mmol/L (ref 3.5–5.1)
Sodium: 137 mmol/L (ref 135–145)
Total Bilirubin: 1.7 mg/dL — ABNORMAL HIGH (ref 0.3–1.2)
Total Protein: 6.3 g/dL — ABNORMAL LOW (ref 6.5–8.1)

## 2020-04-12 LAB — FERRITIN: Ferritin: 1272 ng/mL — ABNORMAL HIGH (ref 11–307)

## 2020-04-12 LAB — PROCALCITONIN
Procalcitonin: 0.44 ng/mL
Procalcitonin: 0.48 ng/mL

## 2020-04-12 LAB — TYPE AND SCREEN
ABO/RH(D): B POS
Antibody Screen: NEGATIVE

## 2020-04-12 LAB — LACTATE DEHYDROGENASE: LDH: 725 U/L — ABNORMAL HIGH (ref 98–192)

## 2020-04-12 LAB — C-REACTIVE PROTEIN: CRP: 5.9 mg/dL — ABNORMAL HIGH (ref <1.0)

## 2020-04-12 LAB — D-DIMER, QUANTITATIVE: D-Dimer, Quant: 1.71 ug/mL-FEU — ABNORMAL HIGH (ref 0.00–0.50)

## 2020-04-12 LAB — FIBRINOGEN: Fibrinogen: 262 mg/dL (ref 210–475)

## 2020-04-12 LAB — PROTIME-INR
INR: 0.9 (ref 0.8–1.2)
Prothrombin Time: 12.1 seconds (ref 11.4–15.2)

## 2020-04-12 MED ORDER — SODIUM CHLORIDE 0.9 % IV SOLN
100.0000 mg | INTRAVENOUS | Status: AC
  Administered 2020-04-12 (×2): 100 mg via INTRAVENOUS
  Filled 2020-04-12 (×2): qty 20

## 2020-04-12 MED ORDER — GUAIFENESIN-DM 100-10 MG/5ML PO SYRP
10.0000 mL | ORAL_SOLUTION | ORAL | Status: DC | PRN
  Filled 2020-04-12: qty 10

## 2020-04-12 MED ORDER — ONDANSETRON HCL 4 MG PO TABS: 4.0000 mg | ORAL_TABLET | Freq: Four times a day (QID) | ORAL | Status: DC | PRN

## 2020-04-12 MED ORDER — LEVOTHYROXINE SODIUM 112 MCG PO CAPS
112.0000 ug | ORAL_CAPSULE | Freq: Every day | ORAL | Status: DC
  Administered 2020-04-13 – 2020-04-14 (×2): 112 ug via ORAL
  Filled 2020-04-12 (×4): qty 1

## 2020-04-12 MED ORDER — LORAZEPAM 0.5 MG PO TABS
0.5000 mg | ORAL_TABLET | Freq: Two times a day (BID) | ORAL | Status: DC | PRN
  Administered 2020-04-12 – 2020-04-15 (×6): 0.5 mg via ORAL
  Filled 2020-04-12 (×6): qty 1

## 2020-04-12 MED ORDER — PREDNISONE 20 MG PO TABS
50.0000 mg | ORAL_TABLET | Freq: Every day | ORAL | Status: DC
  Administered 2020-04-15 – 2020-04-16 (×2): 50 mg via ORAL
  Filled 2020-04-12 (×2): qty 1

## 2020-04-12 MED ORDER — METOPROLOL SUCCINATE ER 25 MG PO TB24
25.0000 mg | ORAL_TABLET | Freq: Every day | ORAL | Status: DC
  Administered 2020-04-12 – 2020-04-24 (×9): 25 mg via ORAL
  Filled 2020-04-12 (×13): qty 1

## 2020-04-12 MED ORDER — TRAMADOL HCL 50 MG PO TABS
50.0000 mg | ORAL_TABLET | Freq: Two times a day (BID) | ORAL | Status: DC | PRN
  Administered 2020-04-12 – 2020-04-24 (×16): 50 mg via ORAL
  Filled 2020-04-12 (×18): qty 1

## 2020-04-12 MED ORDER — SODIUM CHLORIDE 0.9 % IV SOLN
100.0000 mg | Freq: Every day | INTRAVENOUS | Status: AC
  Administered 2020-04-13 – 2020-04-16 (×4): 100 mg via INTRAVENOUS
  Filled 2020-04-12 (×4): qty 20

## 2020-04-12 MED ORDER — DULOXETINE HCL 60 MG PO CPEP
60.0000 mg | ORAL_CAPSULE | Freq: Every day | ORAL | Status: DC
  Administered 2020-04-12 – 2020-04-24 (×13): 60 mg via ORAL
  Filled 2020-04-12 (×4): qty 1
  Filled 2020-04-12: qty 2
  Filled 2020-04-12 (×7): qty 1
  Filled 2020-04-12: qty 2

## 2020-04-12 MED ORDER — CYCLOBENZAPRINE HCL 10 MG PO TABS: 5.0000 mg | ORAL_TABLET | Freq: Two times a day (BID) | ORAL | Status: DC | PRN

## 2020-04-12 MED ORDER — SODIUM CHLORIDE 0.9 % IV SOLN: 100.0000 mg | Freq: Every day | INTRAVENOUS | Status: DC

## 2020-04-12 MED ORDER — HYDROCOD POLST-CPM POLST ER 10-8 MG/5ML PO SUER
5.0000 mL | Freq: Two times a day (BID) | ORAL | Status: DC | PRN
  Administered 2020-04-14 – 2020-04-19 (×3): 5 mL via ORAL
  Filled 2020-04-12 (×3): qty 5

## 2020-04-12 MED ORDER — METHYLPREDNISOLONE SODIUM SUCC 125 MG IJ SOLR
1.0000 mg/kg | Freq: Two times a day (BID) | INTRAMUSCULAR | Status: AC
  Administered 2020-04-12 – 2020-04-14 (×6): 122.5 mg via INTRAVENOUS
  Filled 2020-04-12 (×6): qty 2

## 2020-04-12 MED ORDER — SODIUM CHLORIDE 0.9 % IV SOLN: 200.0000 mg | Freq: Once | INTRAVENOUS | Status: DC

## 2020-04-12 MED ORDER — ACETAMINOPHEN 325 MG PO TABS
650.0000 mg | ORAL_TABLET | Freq: Four times a day (QID) | ORAL | Status: DC | PRN
  Administered 2020-04-16: 650 mg via ORAL
  Filled 2020-04-12 (×2): qty 2

## 2020-04-12 MED ORDER — ONDANSETRON HCL 4 MG/2ML IJ SOLN: 4.0000 mg | Freq: Four times a day (QID) | INTRAMUSCULAR | Status: DC | PRN

## 2020-04-12 MED ORDER — ALBUTEROL SULFATE HFA 108 (90 BASE) MCG/ACT IN AERS
2.0000 | INHALATION_SPRAY | Freq: Four times a day (QID) | RESPIRATORY_TRACT | Status: DC
  Administered 2020-04-12 – 2020-04-18 (×26): 2 via RESPIRATORY_TRACT
  Filled 2020-04-12 (×2): qty 6.7

## 2020-04-12 MED ORDER — MONTELUKAST SODIUM 10 MG PO TABS
10.0000 mg | ORAL_TABLET | Freq: Every day | ORAL | Status: DC
  Administered 2020-04-12 – 2020-04-23 (×12): 10 mg via ORAL
  Filled 2020-04-12 (×12): qty 1

## 2020-04-12 NOTE — ED Notes (Signed)
Pt presenting anxious and hypoxic during rounding. Pt O2 Sats in 70s and 80s on 5L Nasal Cannula at this time. Pt placed on 10L HFNC and O2 Sats improved to 96%. Pt status improved and reports being more comfortable at this time.

## 2020-04-12 NOTE — ED Provider Notes (Signed)
[facility] EMERGENCY DEPARTMENT Provider Note   CSN: [id number] Arrival date & time: 04/12/20  9562   History Chief Complaint  Patient presents with  . Shortness of Breath    Angelica Wong is a 69 y.o. female.  The history is provided by the patient.  Shortness of Breath She was diagnosed with COVID-19 3 days ago and comes in because of worsening shortness of breath.  She has been sick for about 10 days with fever, chills, nonproductive cough.  She has lost her sense of smell and taste.  She did receive monoclonal antibody infusion, but has had problems with diarrhea since then.  Dyspnea has become significantly worse over the last 24hours.  Of note, she is not vaccinated for COVID-19.  Past Medical History:  Diagnosis Date  . Anxiety   . Arthritis   . Sciatica   . Thyroid disease     There are no problems to display for this patient.   Past Surgical History:  Procedure Laterality Date  . CESAREAN SECTION    . TONSILLECTOMY       OB History    Gravida  3   Para      Term      Preterm      AB      Living        SAB      TAB      Ectopic      Multiple      Live Births              History reviewed. No pertinent family history.  Social History   Tobacco Use  . Smoking status: Never Smoker  . Smokeless tobacco: Never Used  Vaping Use  . Vaping Use: Never used  Substance Use Topics  . Alcohol use: No  . Drug use: Never    Home Medications Prior to Admission medications   Medication Sig Start Date End Date Taking? Authorizing Provider  albuterol (VENTOLIN HFA) 108 (90 Base) MCG/ACT inhaler  02/16/20   [provider]  allopurinol (ZYLOPRIM) 100 MG tablet Take 100 mg by mouth daily. 10/31/19   [provider]  ascorbic acid (VITAMIN C) 1000 MG tablet Take by mouth. 11/09/17   [provider]  azithromycin (ZITHROMAX) 250 MG tablet Take 250 mg by mouth as directed. Patient not taking: Reported on 04/07/2020  03/13/20   [provider]  cyclobenzaprine (FLEXERIL) 5 MG tablet Take 5 mg by mouth 2 (two) times daily.    [provider]  DULoxetine (CYMBALTA) 60 MG capsule Take 60 mg by mouth daily.    [provider]  folic acid (FOLVITE) 1 MG tablet Take 1 mg by mouth daily.    [provider]  Levothyroxine Sodium 112 MCG CAPS Take 112 mcg by mouth daily.     [provider]  LORazepam (ATIVAN) 0.5 MG tablet Take 0.5 mg by mouth 2 (two) times daily. 2 tablets at bedtime    [provider]  metoprolol succinate (TOPROL-XL) 25 MG 24 hr tablet Take 25 mg by mouth daily. 01/22/20   [provider]  montelukast (SINGULAIR) 10 MG tablet Take 10 mg by mouth daily. 01/29/20   [provider]  Multiple Vitamin (MULTIVITAMIN) tablet Take 1 tablet by mouth daily.    [provider]  predniSONE (DELTASONE) 20 MG tablet Take 20 mg by mouth 2 (two) times daily. Patient not taking: Reported on 04/07/2020 03/13/20   [provider]  simethicone (GAS-X) 80 MG chewable tablet Chew 1 tablet (80 mg total) by mouth every 6 (six) hours as needed for flatulence. 06/21/17   Loren Racer, MD  traMADol (ULTRAM) 50 MG tablet Take by mouth every 12 (twelve) hours as needed.    [provider]  Vitamin D, Ergocalciferol, (DRISDOL) 50000 UNITS CAPS Take 50,000 Units by mouth every 7 (seven) days. Fridays    [provider]    Allergies    Wellbutrin [bupropion hcl]  Review of Systems   Review of Systems  Respiratory: Positive for shortness of breath.   All other systems reviewed and are negative.   Physical Exam Updated Vital Signs BP (!) 142/85 (BP Location: Left Arm)   Pulse 68   Temp 98.6 F (37 C) (Oral)   Resp (!) 22   Ht 5\' 4"  (1.626 m)   Wt 122.5 kg   SpO2 (!) 79%   BMI 46.35 kg/m   Physical Exam Vitals and nursing note reviewed.   69 year old female, appears mildly dyspneic at rest, but is in no  acute distress. Vital signs are significant for elevated respiratory rate and borderline elevated blood pressure. Oxygen saturation is 79%, which is hypoxic. Head is normocephalic and atraumatic. PERRLA, EOMI. Oropharynx is clear. Neck is nontender and supple without adenopathy or JVD. Back is nontender and there is no CVA tenderness. Lungs have bibasilar rales which are more prominent on the right.  There are no wheezes or rhonchi. Chest is nontender. Heart has regular rate and rhythm without murmur. Abdomen is soft, flat, nontender without masses or hepatosplenomegaly and peristalsis is normoactive. Extremities have 1+ edema, full range of motion is present. Skin is warm and dry without rash. Neurologic: Mental status is normal, cranial nerves are intact, there are no motor or sensory deficits.  ED Results / Procedures / Treatments   Labs (all labs ordered are listed, but only abnormal results are displayed) Labs Reviewed  COMPREHENSIVE METABOLIC PANEL - Abnormal; Notable for the following components:      Result Value   Glucose, Bld 107 (*)    Creatinine, Ser 1.03 (*)    Calcium 7.9 (*)    Total Protein 6.3 (*)    Albumin 2.9 (*)    AST 246 (*)    ALT 95 (*)    Alkaline Phosphatase 368 (*)    Total Bilirubin 1.7 (*)    GFR, Estimated 60 (*)    All other components within normal limits  FIBRINOGEN  CBC WITH DIFFERENTIAL/PLATELET  PROTIME-INR  LACTIC ACID, PLASMA  C-REACTIVE PROTEIN  D-DIMER, QUANTITATIVE (NOT AT [facility])  FERRITIN  LACTATE DEHYDROGENASE  PROCALCITONIN  LACTIC ACID, PLASMA  TYPE AND SCREEN    EKG EKG Interpretation  Date/Time:  Saturday April 12 2020 06:26:16 EST Ventricular Rate:  66 PR Interval:    QRS Duration: 89 QT Interval:  440 QTC Calculation: 461 R Axis:   -3 Text Interpretation: Sinus rhythm Normal ECG When compared with ECG of 04/07/2020, No significant change was found Confirmed by 13/12/2019 (Dione Booze) on 04/12/2020 7:23:28  AM   Radiology DG Chest Port 1 View  Result Date: 04/12/2020 CLINICAL DATA:  COVID positive. EXAM: PORTABLE CHEST 1 VIEW COMPARISON:  04/07/2020 FINDINGS: 0649 hours. Lungs are hyperexpanded. Interval progression of patchy peripheral ground-glass airspace opacity compatible with multifocal pneumonia. The cardio pericardial silhouette is enlarged. The visualized bony structures of the thorax show no acute abnormality. Telemetry leads overlie the chest. IMPRESSION: Progression of peripherally predominant patchy ground-glass  attenuation consistent with multifocal pneumonia. Electronically Signed   By: Kennith Center M.D.   On: 04/12/2020 07:26    Procedures Procedures   Medications Ordered in ED Medications - No data to display  ED Course  I have reviewed the triage vital signs and the nursing notes.  Pertinent labs & imaging results that were available during my care of the patient were reviewed by me and considered in my medical decision making (see chart for details).  MDM Rules/Calculators/A&P COVID-19 infection with progressive dyspnea and hypoxia.  Old records are reviewed confirming recent ED visit with positive test for COVID-19, and outpatient administration of monoclonal antibodies.  With supplemental oxygen, oxygen saturation has increased to 97-100%, but she continues to be dyspneic in spite of normal oxygen saturation.  She will clearly need to be admitted.  ECG is normal.  Chest x-ray shows slight progression of interstitial densities consistent with Covid pneumonia.  Case is discussed with Dr. Kerry Hough of [facility], who agrees to admit the patient.  Metabolic panel shows elevation of alkaline phosphatase, transaminases, bilirubin.  Fibrinogen is normal.  Philomena Course was evaluated in Emergency Department on 04/12/2020 for the symptoms described in the history of present illness. She was evaluated in the context of the global COVID-19 pandemic, which necessitated  consideration that the patient might be at risk for infection with the SARS-CoV-2 virus that causes COVID-19. Institutional protocols and algorithms that pertain to the evaluation of patients at risk for COVID-19 are in a state of rapid change based on information released by regulatory bodies including the CDC and federal and state organizations. These policies and algorithms were followed during the patient's care in the ED.  Final Clinical Impression(s) / ED Diagnoses Final diagnoses:  Pneumonia due to COVID-19 virus  Hypoxia    Rx / DC Orders ED Discharge Orders    None       Dione Booze, MD 04/13/20 [phone]

## 2020-04-12 NOTE — ED Triage Notes (Signed)
Pt tested COVID+ on 04/07/20, c/o SOB x 3 days worsening since last night; diarrhea uncontrolled and left ear pain

## 2020-04-12 NOTE — ED Notes (Signed)
Attending in room °

## 2020-04-12 NOTE — H&P (Signed)
History and Physical    Angelica Wong GBE:[id number] DOB: 09-10-1950 DOA: 04/12/2020  PCP: Lenoria Chime, FNP  Patient coming from: home  I have personally briefly reviewed patient's old medical records in [facility]  Chief Complaint: shortness of breath  HPI: Angelica Wong is a 69 y.o. female with medical history significant of hypertension, hypothyroidism, morbid obesity, was recently diagnosed with COVID-19 approximately 10 days ago.  She was having fever, cough, malaise.  She was seen on Monday in the emergency room and at that time she was not hypoxic.  She received a monoclonal antibody infusion the following day on 11/9.  She reports over the past 3 days she has had worsening shortness of breath.  She is also had frequent diarrhea, abdominal cramping and nausea.  She has not had any vomiting.  She continues to have fevers.  Her husband also has COVID-19, but overall is doing better than she is.  She is unvaccinated.  ED Course: On arrival to the emergency room, she is noted to be hypoxic on room air with O2 saturations in the 70s.  Chest x-ray confirms COVID-19 pneumonia.  Inflammatory markers are elevated.  She does have a platelet count in the 50s.  LFTs are elevated.  Review of Systems:  Review of Systems  Constitutional: Positive for fever and malaise/fatigue.  HENT: Positive for sore throat. Negative for congestion.   Eyes: Negative for blurred vision and double vision.  Respiratory: Positive for cough, sputum production and shortness of breath.   Cardiovascular: Negative for chest pain and leg swelling.  Gastrointestinal: Positive for abdominal pain, diarrhea and nausea. Negative for blood in stool, melena and vomiting.  Genitourinary: Negative for dysuria.  Musculoskeletal: Positive for myalgias.  Neurological: Negative for dizziness and loss of consciousness.  Psychiatric/Behavioral: The patient is nervous/anxious.       Past Medical History:  Diagnosis  Date  . Anxiety   . Arthritis   . Sciatica   . Thyroid disease     Past Surgical History:  Procedure Laterality Date  . CESAREAN SECTION    . TONSILLECTOMY      Social History:  reports that she has never smoked. She has never used smokeless tobacco. She reports that she does not drink alcohol and does not use drugs.  Allergies  Allergen Reactions  . Wellbutrin [Bupropion Hcl] Other (See Comments)    Uncontrollable Crying    Family history: Family history reviewed and not pertinent  Prior to Admission medications   Medication Sig Start Date End Date Taking? Authorizing Provider  albuterol (VENTOLIN HFA) 108 (90 Base) MCG/ACT inhaler  02/16/20   [provider]  allopurinol (ZYLOPRIM) 100 MG tablet Take 100 mg by mouth daily. 10/31/19   [provider]  ascorbic acid (VITAMIN C) 1000 MG tablet Take by mouth. 11/09/17   [provider]  azithromycin (ZITHROMAX) 250 MG tablet Take 250 mg by mouth as directed. Patient not taking: Reported on 04/07/2020 03/13/20   [provider]  cyclobenzaprine (FLEXERIL) 5 MG tablet Take 5 mg by mouth 2 (two) times daily.    [provider]  DULoxetine (CYMBALTA) 60 MG capsule Take 60 mg by mouth daily.    [provider]  folic acid (FOLVITE) 1 MG tablet Take 1 mg by mouth daily.    [provider]  Levothyroxine Sodium 112 MCG CAPS Take 112 mcg by mouth daily.     [provider]  LORazepam (ATIVAN) 0.5 MG tablet Take 0.5 mg  by mouth 2 (two) times daily. 2 tablets at bedtime    [provider]  metoprolol succinate (TOPROL-XL) 25 MG 24 hr tablet Take 25 mg by mouth daily. 01/22/20   [provider]  montelukast (SINGULAIR) 10 MG tablet Take 10 mg by mouth daily. 01/29/20   [provider]  Multiple Vitamin (MULTIVITAMIN) tablet Take 1 tablet by mouth daily.    [provider]  predniSONE (DELTASONE) 20 MG tablet Take 20 mg by mouth 2 (two) times  daily. Patient not taking: Reported on 04/07/2020 03/13/20   [provider]  simethicone (GAS-X) 80 MG chewable tablet Chew 1 tablet (80 mg total) by mouth every 6 (six) hours as needed for flatulence. 06/21/17   Loren Racer, MD  traMADol (ULTRAM) 50 MG tablet Take by mouth every 12 (twelve) hours as needed.    [provider]  Vitamin D, Ergocalciferol, (DRISDOL) 50000 UNITS CAPS Take 50,000 Units by mouth every 7 (seven) days. Fridays    [provider]    Physical Exam: Vitals:   04/12/20 0745 04/12/20 0800 04/12/20 0815 04/12/20 0830  BP:  132/88  137/89  Pulse: 66 66    Resp: (!) 22 20 (!) 27 (!) 25  Temp:      TempSrc:      SpO2: 100% 100%    Weight:      Height:        Constitutional: NAD, calm, comfortable Eyes: PERRL, lids and conjunctivae normal ENMT: Mucous membranes are moist. Posterior pharynx clear of any exudate or lesions.Normal dentition.  Neck: normal, supple, no masses, no thyromegaly Respiratory: clear to auscultation bilaterally, no wheezing, no crackles. Normal respiratory effort. No accessory muscle use.  Cardiovascular: Regular rate and rhythm, no murmurs / rubs / gallops. No extremity edema. 2+ pedal pulses. No carotid bruits.  Abdomen: no tenderness, no masses palpated. No hepatosplenomegaly. Bowel sounds positive.  Musculoskeletal: no clubbing / cyanosis. No joint deformity upper and lower extremities. Good ROM, no contractures. Normal muscle tone.  Skin: no rashes, lesions, ulcers. No induration Neurologic: CN 2-12 grossly intact. Sensation intact, DTR normal. Strength 5/5 in all 4.  Psychiatric: Normal judgment and insight. Alert and oriented x 3. Normal mood.    Labs on Admission: I have personally reviewed following labs and imaging studies  CBC: Recent Labs  Lab 04/12/20 0640  WBC 3.1*  NEUTROABS 1.9  HGB 13.8  HCT 41.9  MCV 88.6  PLT 53*   Basic Metabolic Panel: Recent Labs  Lab 04/12/20 0640  NA 137  K  4.3  CL 105  CO2 25  GLUCOSE 107*  BUN 17  CREATININE 1.03*  CALCIUM 7.9*   GFR: Estimated Creatinine Clearance: 68.4 mL/min (A) (by C-G formula based on SCr of 1.03 mg/dL (H)). Liver Function Tests: Recent Labs  Lab 04/12/20 0640  AST 246*  ALT 95*  ALKPHOS 368*  BILITOT 1.7*  PROT 6.3*  ALBUMIN 2.9*   No results for input(s): LIPASE, AMYLASE in the last 168 hours. No results for input(s): AMMONIA in the last 168 hours. Coagulation Profile: Recent Labs  Lab 04/12/20 0640  INR 0.9   Cardiac Enzymes: No results for input(s): CKTOTAL, CKMB, CKMBINDEX, TROPONINI in the last 168 hours. BNP (last 3 results) No results for input(s): PROBNP in the last 8760 hours. HbA1C: No results for input(s): HGBA1C in the last 72 hours. CBG: No results for input(s): GLUCAP in the last 168 hours. Lipid Profile: No results for input(s): CHOL, HDL, LDLCALC, TRIG,  CHOLHDL, LDLDIRECT in the last 72 hours. Thyroid Function Tests: No results for input(s): TSH, T4TOTAL, FREET4, T3FREE, THYROIDAB in the last 72 hours. Anemia Panel: Recent Labs    04/12/20 0655  FERRITIN 1,272*   Urine analysis:    Component Value Date/Time   COLORURINE YELLOW 08/05/2011 1107   APPEARANCEUR CLEAR 08/05/2011 1107   LABSPEC 1.025 08/05/2011 1107   PHURINE 6.0 08/05/2011 1107   GLUCOSEU NEGATIVE 08/05/2011 1107   HGBUR NEGATIVE 08/05/2011 1107   BILIRUBINUR NEGATIVE 08/05/2011 1107   KETONESUR NEGATIVE 08/05/2011 1107   PROTEINUR NEGATIVE 08/05/2011 1107   UROBILINOGEN 0.2 08/05/2011 1107   NITRITE NEGATIVE 08/05/2011 1107   LEUKOCYTESUR NEGATIVE 08/05/2011 1107    Radiological Exams on Admission: DG Chest Port 1 View  Result Date: 04/12/2020 CLINICAL DATA:  COVID positive. EXAM: PORTABLE CHEST 1 VIEW COMPARISON:  04/07/2020 FINDINGS: 0649 hours. Lungs are hyperexpanded. Interval progression of patchy peripheral ground-glass airspace opacity compatible with multifocal pneumonia. The cardio  pericardial silhouette is enlarged. The visualized bony structures of the thorax show no acute abnormality. Telemetry leads overlie the chest. IMPRESSION: Progression of peripherally predominant patchy ground-glass attenuation consistent with multifocal pneumonia. Electronically Signed   By: Kennith Center M.D.   On: 04/12/2020 07:26    EKG: Independently reviewed. Sinus rhythm without acute changes  Assessment/Plan Active Problems:   Pneumonia due to COVID-19 virus   Acute respiratory failure with hypoxia (HCC)   Thrombocytopenia (HCC)   Obesity, Class III, BMI 40-49.9 (morbid obesity) (HCC)   Essential hypertension   Hypothyroidism   Elevated LFTs     Acute respiratory failure with hypoxia -Secondary to COVID-19 pneumonia -Currently on 2 L of oxygen -Wean off oxygen as tolerated  COVID-19 pneumonia -Patient is unvaccinated -Inflammatory markers are elevated -Check procalcitonin -Start remdesivir -Continue to follow LFTs and if they trend up, will need to discontinue remdesivir -Start IV steroids -She recently received a monoclonal antibody infusion on 11/9  Elevated liver enzymes -She does report being diagnosed with fatty liver in the past -She does not have any abdominal pain at this time -Check viral hepatitis panel -Check right upper quadrant ultrasound  Thrombocytopenia -Patient reports is a chronic issue for which she has been evaluated hematology clinic at [facility] -She is unaware what her baseline platelet counts run -We will request records from [place] -Thrombocytopenia may be exacerbated by underlying viral process -No signs of bleeding at this time -Continue to monitor  Hypothyroidism -Continue on Synthroid  Morbid obesity -Increases risk of morbidity/mortality with COVID-19  Hypertension -Continue metoprolol  DVT prophylaxis: SCDs Code Status: full code  Family Communication: updated husband over the phone  Disposition Plan: discharge  home once respiratory status has stabilized  Consults called:   Admission status: inpatient, medsurg   Erick Blinks MD [facility]   If 7PM-7AM, please contact night-coverage [email or web address]   04/12/2020, 9:25 AM

## 2020-04-13 DIAGNOSIS — J9601 Acute respiratory failure with hypoxia: Secondary | ICD-10-CM | POA: Diagnosis not present

## 2020-04-13 DIAGNOSIS — R7989 Other specified abnormal findings of blood chemistry: Secondary | ICD-10-CM | POA: Diagnosis not present

## 2020-04-13 DIAGNOSIS — I10 Essential (primary) hypertension: Secondary | ICD-10-CM | POA: Diagnosis not present

## 2020-04-13 DIAGNOSIS — U071 COVID-19: Secondary | ICD-10-CM | POA: Diagnosis not present

## 2020-04-13 DIAGNOSIS — J1282 Pneumonia due to coronavirus disease 2019: Secondary | ICD-10-CM

## 2020-04-13 LAB — CBC WITH DIFFERENTIAL/PLATELET
Abs Immature Granulocytes: 0.01 10*3/uL (ref 0.00–0.07)
Basophils Absolute: 0 10*3/uL (ref 0.0–0.1)
Basophils Relative: 1 %
Eosinophils Absolute: 0 10*3/uL (ref 0.0–0.5)
Eosinophils Relative: 0 %
HCT: 42 % (ref 36.0–46.0)
Hemoglobin: 14 g/dL (ref 12.0–15.0)
Immature Granulocytes: 1 %
Lymphocytes Relative: 18 %
Lymphs Abs: 0.3 10*3/uL — ABNORMAL LOW (ref 0.7–4.0)
MCH: 29.4 pg (ref 26.0–34.0)
MCHC: 33.3 g/dL (ref 30.0–36.0)
MCV: 88.1 fL (ref 80.0–100.0)
Monocytes Absolute: 0.1 10*3/uL (ref 0.1–1.0)
Monocytes Relative: 6 %
Neutro Abs: 1.3 10*3/uL — ABNORMAL LOW (ref 1.7–7.7)
Neutrophils Relative %: 74 %
Platelets: 59 10*3/uL — ABNORMAL LOW (ref 150–400)
RBC: 4.77 MIL/uL (ref 3.87–5.11)
RDW: 15.3 % (ref 11.5–15.5)
WBC: 1.7 10*3/uL — ABNORMAL LOW (ref 4.0–10.5)
nRBC: 0 % (ref 0.0–0.2)

## 2020-04-13 LAB — CBG MONITORING, ED
Glucose-Capillary: 139 mg/dL — ABNORMAL HIGH (ref 70–99)
Glucose-Capillary: 150 mg/dL — ABNORMAL HIGH (ref 70–99)
Glucose-Capillary: 125 mg/dL — ABNORMAL HIGH (ref 70–99)
Glucose-Capillary: 109 mg/dL — ABNORMAL HIGH (ref 70–99)

## 2020-04-13 LAB — COMPREHENSIVE METABOLIC PANEL
ALT: 92 U/L — ABNORMAL HIGH (ref 0–44)
AST: 194 U/L — ABNORMAL HIGH (ref 15–41)
Albumin: 3 g/dL — ABNORMAL LOW (ref 3.5–5.0)
Alkaline Phosphatase: 389 U/L — ABNORMAL HIGH (ref 38–126)
Anion gap: 10 (ref 5–15)
BUN: 20 mg/dL (ref 8–23)
CO2: 23 mmol/L (ref 22–32)
Calcium: 8.3 mg/dL — ABNORMAL LOW (ref 8.9–10.3)
Chloride: 104 mmol/L (ref 98–111)
Creatinine, Ser: 0.93 mg/dL (ref 0.44–1.00)
GFR, Estimated: >60 mL/min (ref >60)
Glucose, Bld: 158 mg/dL — ABNORMAL HIGH (ref 70–99)
Potassium: 4.9 mmol/L (ref 3.5–5.1)
Sodium: 137 mmol/L (ref 135–145)
Total Bilirubin: 1.7 mg/dL — ABNORMAL HIGH (ref 0.3–1.2)
Total Protein: 7 g/dL (ref 6.5–8.1)

## 2020-04-13 LAB — HEMOGLOBIN A1C
Hgb A1c MFr Bld: 5.6 % (ref 4.8–5.6)
Mean Plasma Glucose: 114.02 mg/dL

## 2020-04-13 LAB — FERRITIN: Ferritin: 729 ng/mL — ABNORMAL HIGH (ref 11–307)

## 2020-04-13 LAB — D-DIMER, QUANTITATIVE: D-Dimer, Quant: 1.87 ug/mL-FEU — ABNORMAL HIGH (ref 0.00–0.50)

## 2020-04-13 LAB — C-REACTIVE PROTEIN: CRP: 7.4 mg/dL — ABNORMAL HIGH (ref <1.0)

## 2020-04-13 LAB — HIV ANTIBODY (ROUTINE TESTING W REFLEX): HIV Screen 4th Generation wRfx: NONREACTIVE

## 2020-04-13 MED ORDER — INSULIN ASPART 100 UNIT/ML ~~LOC~~ SOLN
4.0000 [IU] | Freq: Three times a day (TID) | SUBCUTANEOUS | Status: DC
  Administered 2020-04-14: 4 [IU] via SUBCUTANEOUS

## 2020-04-13 MED ORDER — INSULIN ASPART 100 UNIT/ML ~~LOC~~ SOLN
0.0000 [IU] | Freq: Every day | SUBCUTANEOUS | Status: DC
  Filled 2020-04-13: qty 1

## 2020-04-13 MED ORDER — BARICITINIB 2 MG PO TABS
4.0000 mg | ORAL_TABLET | Freq: Every day | ORAL | Status: DC
  Administered 2020-04-13 – 2020-04-23 (×11): 4 mg via ORAL
  Filled 2020-04-13 (×11): qty 2

## 2020-04-13 MED ORDER — INSULIN ASPART 100 UNIT/ML ~~LOC~~ SOLN
0.0000 [IU] | Freq: Three times a day (TID) | SUBCUTANEOUS | Status: DC
  Administered 2020-04-14 – 2020-04-18 (×7): 3 [IU] via SUBCUTANEOUS
  Administered 2020-04-18: 7 [IU] via SUBCUTANEOUS
  Administered 2020-04-19 (×2): 3 [IU] via SUBCUTANEOUS
  Administered 2020-04-21: 4 [IU] via SUBCUTANEOUS
  Administered 2020-04-21: 3 [IU] via SUBCUTANEOUS
  Administered 2020-04-22: 4 [IU] via SUBCUTANEOUS
  Administered 2020-04-23: 3 [IU] via SUBCUTANEOUS
  Administered 2020-04-24: 4 [IU] via SUBCUTANEOUS
  Administered 2020-04-24: 3 [IU] via SUBCUTANEOUS

## 2020-04-13 MED ORDER — LEVOFLOXACIN IN D5W 750 MG/150ML IV SOLN
750.0000 mg | INTRAVENOUS | Status: AC
  Administered 2020-04-13 – 2020-04-17 (×5): 750 mg via INTRAVENOUS
  Filled 2020-04-13 (×5): qty 150

## 2020-04-13 NOTE — ED Notes (Signed)
Pt reported difficulty breathing to RN. Pt had difficulty catching breath after transitioning from bedside commode to bed. Pt O2 Sats on 11L HFNC in 70s. Pt placed in prone position with O2 increased to 13L HFNC. Pts O2 Sats improved to 91%. DO, Notified.

## 2020-04-13 NOTE — Progress Notes (Signed)
Came into room to give treatment, patient sat was low 80s and into upper 70s.  Had patient lay on her side and raised her to 11L on her [place] HFNC.  Patient sat has came up to 90% at this time.  Patient does have COVID PNA.  Instructed patient to lay over as far as she can on her side to help sats, and if her hips start bothering her to rotate to different sides if need be.

## 2020-04-13 NOTE — ED Notes (Signed)
RT at bedside.

## 2020-04-13 NOTE — ED Notes (Signed)
ED TO INPATIENT HANDOFF REPORT  ED Nurse Name and Phone #: Neldon Mc RN [phone]  S Name/Age/Gender Philomena Course 69 y.o. female Room/Bed: APA07/APA07  Code Status   Code Status: Full Code  Home/SNF/Other Home Patient oriented to: self Is this baseline? Yes   Triage Complete: Triage complete  Chief Complaint Pneumonia due to COVID-19 virus [U07.1, J12.82]  Triage Note Pt tested COVID+ on 04/07/20, c/o SOB x 3 days worsening since last night; diarrhea uncontrolled and left ear pain     Allergies Allergies  Allergen Reactions  . Wellbutrin [Bupropion Hcl] Other (See Comments)    Uncontrollable Crying    Level of Care/Admitting Diagnosis ED Disposition    ED Disposition Condition Comment   Admit  Hospital Area: [facility] [[id number]]  Level of Care: Stepdown [14]  Covid Evaluation: Confirmed COVID Positive  Diagnosis: Pneumonia due to COVID-19 virus [[id number]]  Admitting Physician: Cleora Fleet [[id number]]  Attending Physician: Cleora Fleet [[id number]]  Estimated length of stay: past midnight tomorrow  Certification:: I certify this patient will need inpatient services for at least 2 midnights       B Medical/Surgery History Past Medical History:  Diagnosis Date  . Anxiety   . Arthritis   . Sciatica   . Thyroid disease    Past Surgical History:  Procedure Laterality Date  . CESAREAN SECTION    . TONSILLECTOMY       A IV Location/Drains/Wounds Patient Lines/Drains/Airways Status    Active Line/Drains/Airways    Name Placement date Placement time Site Days   Peripheral IV 04/12/20 Right Antecubital 04/12/20  0635  Antecubital  1          Intake/Output Last 24 hours  Intake/Output Summary (Last 24 hours) at 04/13/2020 2332 Last data filed at 04/13/2020 1140 Gross per 24 hour  Intake 250 ml  Output --  Net 250 ml    Labs/Imaging Results for orders placed or performed during the hospital encounter of 04/12/20 (from  the past 48 hour(s))  Comprehensive metabolic panel     Status: Abnormal   Collection Time: 04/12/20  6:40 AM  Result Value Ref Range   Sodium 137 135 - 145 mmol/L   Potassium 4.3 3.5 - 5.1 mmol/L   Chloride 105 98 - 111 mmol/L   CO2 25 22 - 32 mmol/L   Glucose, Bld 107 (H) 70 - 99 mg/dL    Comment: Glucose reference range applies only to samples taken after fasting for at least 8 hours.   BUN 17 8 - 23 mg/dL   Creatinine, Ser 2.95 (H) 0.44 - 1.00 mg/dL   Calcium 7.9 (L) 8.9 - 10.3 mg/dL   Total Protein 6.3 (L) 6.5 - 8.1 g/dL   Albumin 2.9 (L) 3.5 - 5.0 g/dL   AST 621 (H) 15 - 41 U/L   ALT 95 (H) 0 - 44 U/L   Alkaline Phosphatase 368 (H) 38 - 126 U/L   Total Bilirubin 1.7 (H) 0.3 - 1.2 mg/dL   GFR, Estimated 60 (L) >60 mL/min    Comment: (NOTE) Calculated using the CKD-EPI Creatinine Equation (2021)    Anion gap 7 5 - 15    Comment: Performed at [facility], [address] [place], [place] [address]  CBC with Differential     Status: Abnormal   Collection Time: 04/12/20  6:40 AM  Result Value Ref Range   WBC 3.1 (L) 4.0 - 10.5 K/uL   RBC 4.73 3.87 - 5.11 MIL/uL  Hemoglobin 13.8 12.0 - 15.0 g/dL   HCT 16.1 36 - 46 %   MCV 88.6 80.0 - 100.0 fL   MCH 29.2 26.0 - 34.0 pg   MCHC 32.9 30.0 - 36.0 g/dL   RDW 09.6 (H) 04.5 - 40.9 %   Platelets 53 (L) 150 - 400 K/uL    Comment: REPEATED TO VERIFY PLATELET COUNT CONFIRMED BY SMEAR SPECIMEN CHECKED FOR CLOTS    nRBC 0.0 0.0 - 0.2 %   Neutrophils Relative % 63 %   Neutro Abs 1.9 1.7 - 7.7 K/uL   Lymphocytes Relative 25 %   Lymphs Abs 0.8 0.7 - 4.0 K/uL   Monocytes Relative 12 %   Monocytes Absolute 0.4 0.1 - 1.0 K/uL   Eosinophils Relative 0 %   Eosinophils Absolute 0.0 0.0 - 0.5 K/uL   Basophils Relative 0 %   Basophils Absolute 0.0 0.0 - 0.1 K/uL   Immature Granulocytes 0 %   Abs Immature Granulocytes 0.01 0.00 - 0.07 K/uL    Comment: Performed at [facility], [address], [place], [place] [address]  Protime-INR      Status: None   Collection Time: 04/12/20  6:40 AM  Result Value Ref Range   Prothrombin Time 12.1 11.4 - 15.2 seconds   INR 0.9 0.8 - 1.2    Comment: (NOTE) INR goal varies based on device and disease states. Performed at [facility], [address], [place], [place] [address]   D-dimer, quantitative     Status: Abnormal   Collection Time: 04/12/20  6:40 AM  Result Value Ref Range   D-Dimer, Quant 1.71 (H) 0.00 - 0.50 ug/mL-FEU    Comment: (NOTE) At the manufacturer cut-off value of 0.5 g/mL FEU, this assay has a negative predictive value of 95-100%.This assay is intended for use in conjunction with a clinical pretest probability (PTP) assessment model to exclude pulmonary embolism (PE) and deep venous thrombosis (DVT) in outpatients suspected of PE or DVT. Results should be correlated with clinical presentation. Performed at [facility], [address], [place], [place] [address]   Fibrinogen     Status: None   Collection Time: 04/12/20  6:54 AM  Result Value Ref Range   Fibrinogen 262 210 - 475 mg/dL    Comment: Performed at [facility], [address] [place], [place] [address]  Procalcitonin     Status: None   Collection Time: 04/12/20  6:54 AM  Result Value Ref Range   Procalcitonin 0.44 ng/mL    Comment:        Interpretation: PCT (Procalcitonin) <= 0.5 ng/mL: Systemic infection (sepsis) is not likely. Local bacterial infection is possible. (NOTE)       Sepsis PCT Algorithm           Lower Respiratory Tract                                      Infection PCT Algorithm    ----------------------------     ----------------------------         PCT < 0.25 ng/mL                PCT < 0.10 ng/mL          Strongly encourage             Strongly discourage   discontinuation of antibiotics    initiation of antibiotics    ----------------------------     -----------------------------  PCT 0.25 - 0.50 ng/mL            PCT 0.10 - 0.25 ng/mL               OR       >80%  decrease in PCT            Discourage initiation of                                            antibiotics      Encourage discontinuation           of antibiotics    ----------------------------     -----------------------------         PCT >= 0.50 ng/mL              PCT 0.26 - 0.50 ng/mL               AND        <80% decrease in PCT             Encourage initiation of                                             antibiotics       Encourage continuation           of antibiotics    ----------------------------     -----------------------------        PCT >= 0.50 ng/mL                  PCT > 0.50 ng/mL               AND         increase in PCT                  Strongly encourage                                      initiation of antibiotics    Strongly encourage escalation           of antibiotics                                     -----------------------------                                           PCT <= 0.25 ng/mL                                                 OR                                        > 80% decrease in PCT  Discontinue / Do not initiate                                             antibiotics  Performed at [facility], [address] [place], [place] [address]   C-reactive protein     Status: Abnormal   Collection Time: 04/12/20  6:55 AM  Result Value Ref Range   CRP 5.9 (H) <1.0 mg/dL    Comment: Performed at [facility], [address], [place], [place] [address]  Ferritin (Iron Binding Protein)     Status: Abnormal   Collection Time: 04/12/20  6:55 AM  Result Value Ref Range   Ferritin 1,272 (H) 11 - 307 ng/mL    Comment: Performed at [facility], [address] [place], [place] [address]  Lactate dehydrogenase     Status: Abnormal   Collection Time: 04/12/20  6:55 AM  Result Value Ref Range   LDH 725 (H) 98 - 192 U/L    Comment: Performed at [facility], [address] [place], [place] [address]  Type and  screen [facility]     Status: None   Collection Time: 04/12/20  8:08 AM  Result Value Ref Range   ABO/RH(D) B POS    Antibody Screen NEG    Sample Expiration      04/15/2020,2359 Performed at [facility], [address], [place], [place] [address]   Lactic acid, plasma     Status: None   Collection Time: 04/12/20  8:08 AM  Result Value Ref Range   Lactic Acid, Venous 1.5 0.5 - 1.9 mmol/L    Comment: Performed at [facility], [address] [place], [place] [address]  Lactic acid, plasma     Status: Abnormal   Collection Time: 04/12/20 10:30 AM  Result Value Ref Range   Lactic Acid, Venous 2.6 (HH) 0.5 - 1.9 mmol/L    Comment: CRITICAL RESULT CALLED TO, READ BACK BY AND VERIFIED WITH: GLINDA @ 1110 ON [id number] BY HENDERSON L. Performed at [facility], [address], [place], [place] [address]   HIV Antibody (routine testing w rflx)     Status: None   Collection Time: 04/12/20 10:30 AM  Result Value Ref Range   HIV Screen 4th Generation wRfx Non Reactive Non Reactive    Comment: Performed at [facility], [address] [place], [place] [address]  Procalcitonin     Status: None   Collection Time: 04/12/20 10:30 AM  Result Value Ref Range   Procalcitonin 0.48 ng/mL    Comment:        Interpretation: PCT (Procalcitonin) <= 0.5 ng/mL: Systemic infection (sepsis) is not likely. Local bacterial infection is possible. (NOTE)       Sepsis PCT Algorithm           Lower Respiratory Tract                                      Infection PCT Algorithm    ----------------------------     ----------------------------         PCT < 0.25 ng/mL                PCT < 0.10 ng/mL          Strongly encourage  Strongly discourage   discontinuation of antibiotics    initiation of antibiotics    ----------------------------     -----------------------------       PCT 0.25 - 0.50 ng/mL            PCT 0.10 - 0.25 ng/mL               OR       >80% decrease in PCT             Discourage initiation of                                            antibiotics      Encourage discontinuation           of antibiotics    ----------------------------     -----------------------------         PCT >= 0.50 ng/mL              PCT 0.26 - 0.50 ng/mL               AND        <80% decrease in PCT             Encourage initiation of                                             antibiotics       Encourage continuation           of antibiotics    ----------------------------     -----------------------------        PCT >= 0.50 ng/mL                  PCT > 0.50 ng/mL               AND         increase in PCT                  Strongly encourage                                      initiation of antibiotics    Strongly encourage escalation           of antibiotics                                     -----------------------------                                           PCT <= 0.25 ng/mL                                                 OR                                        >  80% decrease in PCT                                      Discontinue / Do not initiate                                             antibiotics  Performed at [facility], [address] [place], [place] [address]   CBC with Differential/Platelet     Status: Abnormal   Collection Time: 04/13/20  4:56 AM  Result Value Ref Range   WBC 1.7 (L) 4.0 - 10.5 K/uL   RBC 4.77 3.87 - 5.11 MIL/uL   Hemoglobin 14.0 12.0 - 15.0 g/dL   HCT 60.442.0 36 - 46 %   MCV 88.1 80.0 - 100.0 fL   MCH 29.4 26.0 - 34.0 pg   MCHC 33.3 30.0 - 36.0 g/dL   RDW 54.015.3 98.111.5 - 19.115.5 %   Platelets 59 (L) 150 - 400 K/uL    Comment: REPEATED TO VERIFY PLATELET COUNT CONFIRMED BY SMEAR SPECIMEN CHECKED FOR CLOTS Immature Platelet Fraction may be clinically indicated, consider ordering this additional test [id number]    nRBC 0.0 0.0 - 0.2 %   Neutrophils Relative % 74 %   Neutro Abs 1.3 (L) 1.7 - 7.7 K/uL   Lymphocytes Relative 18 %    Lymphs Abs 0.3 (L) 0.7 - 4.0 K/uL   Monocytes Relative 6 %   Monocytes Absolute 0.1 0.1 - 1.0 K/uL   Eosinophils Relative 0 %   Eosinophils Absolute 0.0 0.0 - 0.5 K/uL   Basophils Relative 1 %   Basophils Absolute 0.0 0.0 - 0.1 K/uL   Immature Granulocytes 1 %   Abs Immature Granulocytes 0.01 0.00 - 0.07 K/uL    Comment: Performed at [facility], [address] [place], [place] [address]  Comprehensive metabolic panel     Status: Abnormal   Collection Time: 04/13/20  4:56 AM  Result Value Ref Range   Sodium 137 135 - 145 mmol/L   Potassium 4.9 3.5 - 5.1 mmol/L   Chloride 104 98 - 111 mmol/L   CO2 23 22 - 32 mmol/L   Glucose, Bld 158 (H) 70 - 99 mg/dL    Comment: Glucose reference range applies only to samples taken after fasting for at least 8 hours.   BUN 20 8 - 23 mg/dL   Creatinine, Ser 5.780.93 0.44 - 1.00 mg/dL   Calcium 8.3 (L) 8.9 - 10.3 mg/dL   Total Protein 7.0 6.5 - 8.1 g/dL   Albumin 3.0 (L) 3.5 - 5.0 g/dL   AST 469194 (H) 15 - 41 U/L   ALT 92 (H) 0 - 44 U/L   Alkaline Phosphatase 389 (H) 38 - 126 U/L   Total Bilirubin 1.7 (H) 0.3 - 1.2 mg/dL   GFR, Estimated >62>60 >95>60 mL/min    Comment: (NOTE) Calculated using the CKD-EPI Creatinine Equation (2021)    Anion gap 10 5 - 15    Comment: Performed at [facility], [address] [place], [place] [address]  D-dimer, quantitative (not at [facility])     Status: Abnormal   Collection Time: 04/13/20  4:56 AM  Result Value Ref Range   D-Dimer, Quant 1.87 (H) 0.00 - 0.50 ug/mL-FEU    Comment: (NOTE) At the  manufacturer cut-off value of 0.5 g/mL FEU, this assay has a negative predictive value of 95-100%.This assay is intended for use in conjunction with a clinical pretest probability (PTP) assessment model to exclude pulmonary embolism (PE) and deep venous thrombosis (DVT) in outpatients suspected of PE or DVT. Results should be correlated with clinical presentation. Performed at [facility], [address] [place], [place]  [address]   CBG monitoring, ED     Status: Abnormal   Collection Time: 04/13/20  8:52 AM  Result Value Ref Range   Glucose-Capillary 139 (H) 70 - 99 mg/dL    Comment: Glucose reference range applies only to samples taken after fasting for at least 8 hours.  C-reactive protein     Status: Abnormal   Collection Time: 04/13/20 10:03 AM  Result Value Ref Range   CRP 7.4 (H) <1.0 mg/dL    Comment: Performed at [facility], [address] [place], [place] [address]  Ferritin     Status: Abnormal   Collection Time: 04/13/20 10:03 AM  Result Value Ref Range   Ferritin 729 (H) 11 - 307 ng/mL    Comment: Performed at [facility], [address] [place], [place] [address]  Hemoglobin A1c     Status: None   Collection Time: 04/13/20 10:03 AM  Result Value Ref Range   Hgb A1c MFr Bld 5.6 4.8 - 5.6 %    Comment: (NOTE) Pre diabetes:          5.7%-6.4%  Diabetes:              >6.4%  Glycemic control for   <7.0% adults with diabetes    Mean Plasma Glucose 114.02 mg/dL    Comment: Performed at [facility], [address], [place], [place] [address]  CBG monitoring, ED     Status: Abnormal   Collection Time: 04/13/20 11:55 AM  Result Value Ref Range   Glucose-Capillary 150 (H) 70 - 99 mg/dL    Comment: Glucose reference range applies only to samples taken after fasting for at least 8 hours.  CBG monitoring, ED     Status: Abnormal   Collection Time: 04/13/20  4:51 PM  Result Value Ref Range   Glucose-Capillary 125 (H) 70 - 99 mg/dL    Comment: Glucose reference range applies only to samples taken after fasting for at least 8 hours.  CBG monitoring, ED     Status: Abnormal   Collection Time: 04/13/20 10:11 PM  Result Value Ref Range   Glucose-Capillary 109 (H) 70 - 99 mg/dL    Comment: Glucose reference range applies only to samples taken after fasting for at least 8 hours.   DG Chest Port 1 View  Result Date: 04/12/2020 CLINICAL DATA:  COVID positive. EXAM: PORTABLE CHEST 1 VIEW  COMPARISON:  04/07/2020 FINDINGS: 0649 hours. Lungs are hyperexpanded. Interval progression of patchy peripheral ground-glass airspace opacity compatible with multifocal pneumonia. The cardio pericardial silhouette is enlarged. The visualized bony structures of the thorax show no acute abnormality. Telemetry leads overlie the chest. IMPRESSION: Progression of peripherally predominant patchy ground-glass attenuation consistent with multifocal pneumonia. Electronically Signed   By: Kennith Center M.D.   On: 04/12/2020 07:26   US Abdomen Limited RUQ (LIVER/GB)  Result Date: 04/12/2020 CLINICAL DATA:  Elevated LFTs. EXAM: ULTRASOUND ABDOMEN LIMITED RIGHT UPPER QUADRANT COMPARISON:  None. FINDINGS: Gallbladder: No gallstones or gallbladder wall thickening. No pericholecystic fluid. The sonographer reports no sonographic Murphy's sign. Common bile duct: Diameter: 11 mm Liver: Liver  parenchyma is echogenic and heterogeneous. Portal vein is patent on color Doppler imaging with normal direction of blood flow towards the liver. Other: None. IMPRESSION: Extrahepatic biliary duct dilatation. MR I/MRCP could be used to further evaluate as clinically warranted. Heterogeneous echogenic liver, potentially related to steatosis. Electronically Signed   By: Kennith Center M.D.   On: 04/12/2020 11:27    Pending Labs Unresulted Labs (From admission, onward)          Start     Ordered   04/13/20 0500  CBC with Differential/Platelet  Daily,   R      04/12/20 0922   04/13/20 0500  Comprehensive metabolic panel  Daily,   R      04/12/20 0922   04/13/20 0500  C-reactive protein  Daily,   R      04/12/20 0922   04/13/20 0500  D-dimer, quantitative (not at [facility])  Daily,   R      04/12/20 0922   04/13/20 0500  Ferritin  Daily,   R      04/12/20 0922   04/12/20 0922  Hepatitis panel, acute  Add-on,   AD        04/12/20 0922          Vitals/Pain Today's Vitals   04/13/20 2200 04/13/20 2218 04/13/20 2230 04/13/20 2300   BP: 100/88  111/71 113/72  Pulse: 65  65 64  Resp: 15     Temp:      TempSrc:      SpO2: 95%  100% 94%  Weight:      Height:      PainSc:  1       Isolation Precautions Airborne and Contact precautions  Medications Medications  traMADol (ULTRAM) tablet 50 mg (50 mg Oral Given 04/13/20 1945)  metoprolol succinate (TOPROL-XL) 24 hr tablet 25 mg (25 mg Oral Given 04/13/20 1108)  DULoxetine (CYMBALTA) DR capsule 60 mg (60 mg Oral Given 04/13/20 1108)  LORazepam (ATIVAN) tablet 0.5 mg (0.5 mg Oral Given 04/13/20 1945)  Levothyroxine Sodium CAPS 112 mcg (112 mcg Oral Given 04/13/20 0856)  cyclobenzaprine (FLEXERIL) tablet 5 mg (has no administration in time range)  montelukast (SINGULAIR) tablet 10 mg (10 mg Oral Given 04/13/20 2210)  albuterol (VENTOLIN HFA) 108 (90 Base) MCG/ACT inhaler 2 puff (2 puffs Inhalation Given 04/13/20 1941)  methylPREDNISolone sodium succinate (SOLU-MEDROL) 125 mg/2 mL injection 122.5 mg (122.5 mg Intravenous Given 04/13/20 2210)    Followed by  predniSONE (DELTASONE) tablet 50 mg (has no administration in time range)  guaiFENesin-dextromethorphan (ROBITUSSIN DM) 100-10 MG/5ML syrup 10 mL (has no administration in time range)  chlorpheniramine-HYDROcodone (TUSSIONEX) 10-8 MG/5ML suspension 5 mL (has no administration in time range)  acetaminophen (TYLENOL) tablet 650 mg (has no administration in time range)  ondansetron (ZOFRAN) tablet 4 mg (has no administration in time range)    Or  ondansetron (ZOFRAN) injection 4 mg (has no administration in time range)  remdesivir 100 mg in sodium chloride 0.9 % 100 mL IVPB (0 mg Intravenous Stopped 04/12/20 1223)    Followed by  remdesivir 100 mg in sodium chloride 0.9 % 100 mL IVPB (0 mg Intravenous Stopped 04/13/20 1140)  baricitinib (OLUMIANT) tablet 4 mg (4 mg Oral Given 04/13/20 1108)  levofloxacin (LEVAQUIN) IVPB 750 mg (0 mg Intravenous Stopped 04/13/20 1029)  insulin aspart (novoLOG) injection 0-20 Units (0  Units Subcutaneous Not Given 04/13/20 1703)  insulin aspart (novoLOG) injection 0-5 Units (0 Units Subcutaneous Not Given 04/13/20 2217)  insulin aspart (novoLOG) injection 4 Units (4 Units Subcutaneous Not Given 04/13/20 1703)    Mobility walks Low fall risk   Focused Assessments    R Recommendations: See Admitting Provider Note  Report given to:  Wendi Maya RN [phone]  Additional Notes:

## 2020-04-13 NOTE — Progress Notes (Signed)
PROGRESS NOTE   Angelica Wong  IRW:[id number] DOB: 1950/07/04 DOA: 04/12/2020 PCP: Lenoria Chime, FNP   Chief Complaint  Patient presents with  . Shortness of Breath    Brief Admission History:  69 y.o. female with medical history significant of hypertension, hypothyroidism, morbid obesity, was recently diagnosed with COVID-19 approximately 10 days ago.  She was having fever, cough, malaise.  She was seen on Monday in the emergency room and at that time she was not hypoxic.  She received a monoclonal antibody infusion the following day on 11/9.  She reports over the past 3 days she has had worsening shortness of breath.  She is also had frequent diarrhea, abdominal cramping and nausea.  She has not had any vomiting.  She continues to have fevers.  Her husband also has COVID-19, but overall is doing better than she is.  She is unvaccinated.    Assessment & Plan:   Active Problems:   Pneumonia due to COVID-19 virus   Acute respiratory failure with hypoxia (HCC)   Thrombocytopenia (HCC)   Obesity, Class III, BMI 40-49.9 (morbid obesity) (HCC)   Essential hypertension   Hypothyroidism   Elevated LFTs   1. Covid Pneumonia - Pt likely has extensive parenchymal damage given rapid increase in oxygen requirements.  She has been started on baricitinib.  Continue remdesivir, steroids and supportive care.  Following inflammatory markers.  She is s/p monoclonal antibody infusion on 11/9.  2. CAP - with elevated procalcitonin I have ordered to start levofloxacin IV to cover super-imposed bacterial infections.  3. Essential hypertension - resumed home metoprolol.  4. Elevated LFTs - thought secondary to hepatic steatosis, holding stable so far, following closely while on remdesivir.  5. Hypothyroidism - resumed home levothyroxine.  6. Morbid obesity -   DVT prophylaxis: SCDs Code Status:  Full  Family Communication: t/c to husband 11/14 Disposition:   Status is: Inpatient  Remains  inpatient appropriate because:IV treatments appropriate due to intensity of illness or inability to take PO and Inpatient level of care appropriate due to severity of illness   Dispo: The patient is from: Home              Anticipated d/c is to: Home              Anticipated d/c date is: > 3 days              Patient currently is not medically stable to d/c.  Consultants:     Procedures:     Antimicrobials:  Levofloxacin 11/14>>  Subjective: Pt reports that she feels oxygen in her ears.  She is coughing but mostly non productive.   Objective: Vitals:   04/13/20 1200 04/13/20 1330 04/13/20 1400 04/13/20 1412  BP: (!) 141/123 108/73 120/77   Pulse: 71 70    Resp: (!) 27 14 (!) 24   Temp:  98 F (36.7 C)    TempSrc:  Oral    SpO2: 94% 94%  91%  Weight:      Height:        Intake/Output Summary (Last 24 hours) at 04/13/2020 1429 Last data filed at 04/13/2020 1140 Gross per 24 hour  Intake 250 ml  Output --  Net 250 ml   Filed Weights   04/12/20 0619  Weight: 122.5 kg    Examination:  General exam: chronically ill appearing, awake, speaking full sentences, oriented x 3, Appears calm and comfortable  Respiratory system: diffuse rales heard bilaterally.  Moderate increased  work of breathing. Cardiovascular system: normal S1 & S2 heard. No JVD, murmurs, rubs, gallops or clicks. 1+ pedal edema. Gastrointestinal system: Abdomen is obese, nondistended, soft and nontender. No organomegaly or masses felt. Normal bowel sounds heard. Central nervous system: Alert and oriented. No focal neurological deficits. Extremities: Symmetric 5 x 5 power. Skin: No rashes, lesions or ulcers Psychiatry: Judgement and insight appear normal. Mood & affect appropriate.   Data Reviewed: I have personally reviewed following labs and imaging studies  CBC: Recent Labs  Lab 04/12/20 0640 04/13/20 0456  WBC 3.1* 1.7*  NEUTROABS 1.9 1.3*  HGB 13.8 14.0  HCT 41.9 42.0  MCV 88.6 88.1   PLT 53* 59*    Basic Metabolic Panel: Recent Labs  Lab 04/12/20 0640 04/13/20 0456  NA 137 137  K 4.3 4.9  CL 105 104  CO2 25 23  GLUCOSE 107* 158*  BUN 17 20  CREATININE 1.03* 0.93  CALCIUM 7.9* 8.3*    GFR: Estimated Creatinine Clearance: 75.8 mL/min (by C-G formula based on SCr of 0.93 mg/dL).  Liver Function Tests: Recent Labs  Lab 04/12/20 0640 04/13/20 0456  AST 246* 194*  ALT 95* 92*  ALKPHOS 368* 389*  BILITOT 1.7* 1.7*  PROT 6.3* 7.0  ALBUMIN 2.9* 3.0*    CBG: Recent Labs  Lab 04/13/20 0852 04/13/20 1155  GLUCAP 139* 150*    No results found for this or any previous visit (from the past 240 hour(s)).   Radiology Studies: DG Chest Port 1 View  Result Date: 04/12/2020 CLINICAL DATA:  COVID positive. EXAM: PORTABLE CHEST 1 VIEW COMPARISON:  04/07/2020 FINDINGS: 0649 hours. Lungs are hyperexpanded. Interval progression of patchy peripheral ground-glass airspace opacity compatible with multifocal pneumonia. The cardio pericardial silhouette is enlarged. The visualized bony structures of the thorax show no acute abnormality. Telemetry leads overlie the chest. IMPRESSION: Progression of peripherally predominant patchy ground-glass attenuation consistent with multifocal pneumonia. Electronically Signed   By: Kennith Center M.D.   On: 04/12/2020 07:26   US Abdomen Limited RUQ (LIVER/GB)  Result Date: 04/12/2020 CLINICAL DATA:  Elevated LFTs. EXAM: ULTRASOUND ABDOMEN LIMITED RIGHT UPPER QUADRANT COMPARISON:  None. FINDINGS: Gallbladder: No gallstones or gallbladder wall thickening. No pericholecystic fluid. The sonographer reports no sonographic Murphy's sign. Common bile duct: Diameter: 11 mm Liver: Liver parenchyma is echogenic and heterogeneous. Portal vein is patent on color Doppler imaging with normal direction of blood flow towards the liver. Other: None. IMPRESSION: Extrahepatic biliary duct dilatation. MR I/MRCP could be used to further evaluate as  clinically warranted. Heterogeneous echogenic liver, potentially related to steatosis. Electronically Signed   By: Kennith Center M.D.   On: 04/12/2020 11:27   Scheduled Meds: . albuterol  2 puff Inhalation Q6H  . baricitinib  4 mg Oral Daily  . DULoxetine  60 mg Oral Daily  . insulin aspart  0-20 Units Subcutaneous TID WC  . insulin aspart  0-5 Units Subcutaneous QHS  . insulin aspart  4 Units Subcutaneous TID WC  . Levothyroxine Sodium  112 mcg Oral Daily  . methylPREDNISolone (SOLU-MEDROL) injection  1 mg/kg Intravenous Q12H   Followed by  . [START ON 04/15/2020] predniSONE  50 mg Oral Daily  . metoprolol succinate  25 mg Oral Daily  . montelukast  10 mg Oral QHS   Continuous Infusions: . levofloxacin (LEVAQUIN) IV Stopped (04/13/20 1029)  . remdesivir 100 mg in NS 100 mL Stopped (04/13/20 1140)     LOS: 1 day   Time spent: 39 mins  Standley Dakins, MD How to contact the [facility] Attending or Consulting provider 7A - 7P or covering provider during after hours 7P -7A, for this patient?  1. Check the care team in [facility] and look for a) attending/consulting TRH provider listed and b) the [facility] team listed 2. Log into [email or web address] and use [place]'s universal password to access. If you do not have the password, please contact the hospital operator. 3. Locate the [facility] provider you are looking for under [facility] and page to a number that you can be directly reached. 4. If you still have difficulty reaching the provider, please page the [facility] (Director on Call) for the Hospitalists listed on amion for assistance.  04/13/2020, 2:29 PM

## 2020-04-14 DIAGNOSIS — U071 COVID-19: Secondary | ICD-10-CM | POA: Diagnosis not present

## 2020-04-14 DIAGNOSIS — R7989 Other specified abnormal findings of blood chemistry: Secondary | ICD-10-CM | POA: Diagnosis not present

## 2020-04-14 DIAGNOSIS — I10 Essential (primary) hypertension: Secondary | ICD-10-CM | POA: Diagnosis not present

## 2020-04-14 DIAGNOSIS — J9601 Acute respiratory failure with hypoxia: Secondary | ICD-10-CM | POA: Diagnosis not present

## 2020-04-14 LAB — GLUCOSE, CAPILLARY
Glucose-Capillary: 129 mg/dL — ABNORMAL HIGH (ref 70–99)
Glucose-Capillary: 96 mg/dL (ref 70–99)
Glucose-Capillary: 115 mg/dL — ABNORMAL HIGH (ref 70–99)
Glucose-Capillary: 128 mg/dL — ABNORMAL HIGH (ref 70–99)

## 2020-04-14 LAB — COMPREHENSIVE METABOLIC PANEL
ALT: 63 U/L — ABNORMAL HIGH (ref 0–44)
AST: 109 U/L — ABNORMAL HIGH (ref 15–41)
Albumin: 2.5 g/dL — ABNORMAL LOW (ref 3.5–5.0)
Alkaline Phosphatase: 289 U/L — ABNORMAL HIGH (ref 38–126)
Anion gap: 7 (ref 5–15)
BUN: 29 mg/dL — ABNORMAL HIGH (ref 8–23)
CO2: 24 mmol/L (ref 22–32)
Calcium: 8 mg/dL — ABNORMAL LOW (ref 8.9–10.3)
Chloride: 104 mmol/L (ref 98–111)
Creatinine, Ser: 1.05 mg/dL — ABNORMAL HIGH (ref 0.44–1.00)
GFR, Estimated: 58 mL/min — ABNORMAL LOW (ref >60)
Glucose, Bld: 130 mg/dL — ABNORMAL HIGH (ref 70–99)
Potassium: 4.5 mmol/L (ref 3.5–5.1)
Sodium: 135 mmol/L (ref 135–145)
Total Bilirubin: 1.2 mg/dL (ref 0.3–1.2)
Total Protein: 5.5 g/dL — ABNORMAL LOW (ref 6.5–8.1)

## 2020-04-14 LAB — D-DIMER, QUANTITATIVE: D-Dimer, Quant: 1.46 ug/mL-FEU — ABNORMAL HIGH (ref 0.00–0.50)

## 2020-04-14 LAB — CBC WITH DIFFERENTIAL/PLATELET
Abs Immature Granulocytes: 0.01 10*3/uL (ref 0.00–0.07)
Basophils Absolute: 0 10*3/uL (ref 0.0–0.1)
Basophils Relative: 0 %
Eosinophils Absolute: 0 10*3/uL (ref 0.0–0.5)
Eosinophils Relative: 0 %
HCT: 36.8 % (ref 36.0–46.0)
Hemoglobin: 12 g/dL (ref 12.0–15.0)
Immature Granulocytes: 0 %
Lymphocytes Relative: 14 %
Lymphs Abs: 0.4 10*3/uL — ABNORMAL LOW (ref 0.7–4.0)
MCH: 28.7 pg (ref 26.0–34.0)
MCHC: 32.6 g/dL (ref 30.0–36.0)
MCV: 88 fL (ref 80.0–100.0)
Monocytes Absolute: 0.3 10*3/uL (ref 0.1–1.0)
Monocytes Relative: 9 %
Neutro Abs: 2.2 10*3/uL (ref 1.7–7.7)
Neutrophils Relative %: 77 %
Platelets: 55 10*3/uL — ABNORMAL LOW (ref 150–400)
RBC: 4.18 MIL/uL (ref 3.87–5.11)
RDW: 15.4 % (ref 11.5–15.5)
WBC: 2.9 10*3/uL — ABNORMAL LOW (ref 4.0–10.5)
nRBC: 0 % (ref 0.0–0.2)

## 2020-04-14 LAB — FERRITIN: Ferritin: 588 ng/mL — ABNORMAL HIGH (ref 11–307)

## 2020-04-14 LAB — C-REACTIVE PROTEIN: CRP: 4.5 mg/dL — ABNORMAL HIGH (ref <1.0)

## 2020-04-14 LAB — MRSA PCR SCREENING: MRSA by PCR: NEGATIVE

## 2020-04-14 MED ORDER — MELATONIN 3 MG PO TABS
6.0000 mg | ORAL_TABLET | Freq: Every evening | ORAL | Status: DC | PRN
  Administered 2020-04-14 – 2020-04-15 (×2): 6 mg via ORAL
  Filled 2020-04-14 (×2): qty 2

## 2020-04-14 MED ORDER — LEVOTHYROXINE SODIUM 112 MCG PO TABS
112.0000 ug | ORAL_TABLET | Freq: Every day | ORAL | Status: DC
  Administered 2020-04-15 – 2020-04-24 (×10): 112 ug via ORAL
  Filled 2020-04-14 (×11): qty 1

## 2020-04-14 MED ORDER — SODIUM CHLORIDE 0.9 % IV SOLN: INTRAVENOUS | Status: AC

## 2020-04-14 MED ORDER — CHLORHEXIDINE GLUCONATE CLOTH 2 % EX PADS
6.0000 | MEDICATED_PAD | Freq: Every day | CUTANEOUS | Status: DC
  Administered 2020-04-14 – 2020-04-24 (×9): 6 via TOPICAL

## 2020-04-14 MED FILL — Levothyroxine Sodium Tab 112 MCG: ORAL | Qty: 1 | Status: AC

## 2020-04-14 NOTE — Progress Notes (Signed)
Transported patient on NRB from APA07 to ICU Room 10 without any complications.  Patient did transfer from ED bed to ICU bed on her own with RN assistance and did become a little anxious with move.  Recommended that patient get back on her side after ICU personnel finished CHG bath to help her recover from transition.  Patient is currently on 15L [place] [facility]NC still and was being hooked up to monitor. Gave transport information, current O2 settings, and all pertinent information to [place], RRT as well as informing him of Respiratory meds location and schedule.

## 2020-04-14 NOTE — Progress Notes (Signed)
PROGRESS NOTE   Angelica Wong  JOA:[id number] DOB: 08/04/1950 DOA: 04/12/2020 PCP: Lenoria Chime, FNP   Chief Complaint  Patient presents with  . Shortness of Breath    Brief Admission History:  69 y.o. female with medical history significant of hypertension, hypothyroidism, morbid obesity, was recently diagnosed with COVID-19 approximately 10 days ago.  She was having fever, cough, malaise.  She was seen on Monday in the emergency room and at that time she was not hypoxic.  She received a monoclonal antibody infusion the following day on 11/9.  She reports over the past 3 days she has had worsening shortness of breath.  She is also had frequent diarrhea, abdominal cramping and nausea.  She has not had any vomiting.  She continues to have fevers.  Her husband also has COVID-19, but overall is doing better than she is.  She is unvaccinated.    Assessment & Plan:   Active Problems:   Pneumonia due to COVID-19 virus   Acute respiratory failure with hypoxia (HCC)   Thrombocytopenia (HCC)   Obesity, Class III, BMI 40-49.9 (morbid obesity) (HCC)   Essential hypertension   Hypothyroidism   Elevated LFTs   1. Covid Pneumonia - Pt likely has extensive parenchymal damage given rapid increase in oxygen requirements.  She has been started on baricitinib.  Continue remdesivir, steroids and supportive care.  Following inflammatory markers which are trending down.  She is s/p monoclonal antibody infusion on 11/9.  2. CAP - with elevated procalcitonin I have ordered to start levofloxacin IV to cover super-imposed bacterial infections. Follow up strep pneumo and legionella tests.  3. Essential hypertension - resumed home metoprolol.  4. Elevated LFTs - thought secondary to hepatic steatosis, holding stable so far, following closely while on remdesivir.  5. Hypothyroidism - resumed home levothyroxine.  6. Morbid obesity - stable.  7. Advanced care planning - palliative care team consulted.   DVT  prophylaxis: SCDs Code Status:  Full  Family Communication: t/c to husband 11/14 Disposition: Home when medically stable   Status is: Inpatient  Remains inpatient appropriate because:IV treatments appropriate due to intensity of illness or inability to take PO and Inpatient level of care appropriate due to severity of illness   Dispo: The patient is from: Home              Anticipated d/c is to: Home              Anticipated d/c date is: > 3 days              Patient currently is not medically stable to d/c.  Consultants:     Procedures:   Antimicrobials:  Levofloxacin 11/14>>  Subjective: Pt says there has been no change.  Her breathing does not feel to be getting worse.  Mostly nonproductive cough.   Objective: Vitals:   04/14/20 0700 04/14/20 0800 04/14/20 0900 04/14/20 1000  BP: 127/64 124/65 (!) 131/101 122/61  Pulse: 61 61 72 64  Resp: (!) 24 20 (!) 22 (!) 24  Temp:      TempSrc:      SpO2: 98% 96% (!) 89% 95%  Weight:      Height:        Intake/Output Summary (Last 24 hours) at 04/14/2020 1045 Last data filed at 04/14/2020 0600 Gross per 24 hour  Intake 340 ml  Output --  Net 340 ml   Filed Weights   04/12/20 0619 04/14/20 0003  Weight: 122.5 kg 122.4 kg  Examination:  General exam: awake, speaking full sentences, oriented x 3, Appears calm and comfortable on 15L/m [place] Respiratory system: diffuse rales heard bilaterally.  Moderate increased work of breathing. Cardiovascular system: normal S1 & S2 heard. No JVD, murmurs, rubs, gallops or clicks. 1+ pedal edema. Gastrointestinal system: Abdomen is obese, nondistended, soft and nontender. No organomegaly or masses felt. Normal bowel sounds heard. Central nervous system: Alert and oriented. No focal neurological deficits. Extremities: Symmetric 5 x 5 power. Skin: No rashes, lesions or ulcers Psychiatry: Judgement and insight appear normal. Mood & affect appropriate.   Data Reviewed: I have personally  reviewed following labs and imaging studies  CBC: Recent Labs  Lab 04/12/20 0640 04/13/20 0456 04/14/20 0556  WBC 3.1* 1.7* 2.9*  NEUTROABS 1.9 1.3* 2.2  HGB 13.8 14.0 12.0  HCT 41.9 42.0 36.8  MCV 88.6 88.1 88.0  PLT 53* 59* 55*    Basic Metabolic Panel: Recent Labs  Lab 04/12/20 0640 04/13/20 0456 04/14/20 0556  NA 137 137 135  K 4.3 4.9 4.5  CL 105 104 104  CO2 25 23 24   GLUCOSE 107* 158* 130*  BUN 17 20 29*  CREATININE 1.03* 0.93 1.05*  CALCIUM 7.9* 8.3* 8.0*    GFR: Estimated Creatinine Clearance: 68.3 mL/min (A) (by C-G formula based on SCr of 1.05 mg/dL (H)).  Liver Function Tests: Recent Labs  Lab 04/12/20 0640 04/13/20 0456 04/14/20 0556  AST 246* 194* 109*  ALT 95* 92* 63*  ALKPHOS 368* 389* 289*  BILITOT 1.7* 1.7* 1.2  PROT 6.3* 7.0 5.5*  ALBUMIN 2.9* 3.0* 2.5*    CBG: Recent Labs  Lab 04/13/20 0852 04/13/20 1155 04/13/20 1651 04/13/20 2211 04/14/20 0850  GLUCAP 139* 150* 125* 109* 129*    No results found for this or any previous visit (from the past 240 hour(s)).   Radiology Studies: 04/16/20 Abdomen Limited RUQ (LIVER/GB)  Result Date: 04/12/2020 CLINICAL DATA:  Elevated LFTs. EXAM: ULTRASOUND ABDOMEN LIMITED RIGHT UPPER QUADRANT COMPARISON:  None. FINDINGS: Gallbladder: No gallstones or gallbladder wall thickening. No pericholecystic fluid. The sonographer reports no sonographic Murphy's sign. Common bile duct: Diameter: 11 mm Liver: Liver parenchyma is echogenic and heterogeneous. Portal vein is patent on color Doppler imaging with normal direction of blood flow towards the liver. Other: None. IMPRESSION: Extrahepatic biliary duct dilatation. MR I/MRCP could be used to further evaluate as clinically warranted. Heterogeneous echogenic liver, potentially related to steatosis. Electronically Signed   By: 04/14/2020 M.D.   On: 04/12/2020 11:27   Scheduled Meds: . albuterol  2 puff Inhalation Q6H  . baricitinib  4 mg Oral Daily  .  Chlorhexidine Gluconate Cloth  6 each Topical Daily  . DULoxetine  60 mg Oral Daily  . insulin aspart  0-20 Units Subcutaneous TID WC  . insulin aspart  0-5 Units Subcutaneous QHS  . insulin aspart  4 Units Subcutaneous TID WC  . [START ON 04/15/2020] levothyroxine  112 mcg Oral Q0600  . methylPREDNISolone (SOLU-MEDROL) injection  1 mg/kg Intravenous Q12H   Followed by  . [START ON 04/15/2020] predniSONE  50 mg Oral Daily  . metoprolol succinate  25 mg Oral Daily  . montelukast  10 mg Oral QHS   Continuous Infusions: . levofloxacin (LEVAQUIN) IV 750 mg (04/14/20 0900)  . remdesivir 100 mg in NS 100 mL Stopped (04/13/20 1140)     LOS: 2 days   Critical Care Procedure Note Authorized and Performed by: 04/15/20 MD  Total Critical Care time:  34 mins Due to a high probability of clinically significant, life threatening deterioration, the patient required my highest level of preparedness to intervene emergently and I personally spent this critical care time directly and personally managing the patient.  This critical care time included obtaining a history; examining the patient, pulse oximetry; ordering and review of studies; arranging urgent treatment with development of a management plan; evaluation of patient's response of treatment; frequent reassessment; and discussions with other providers.  This critical care time was performed to assess and manage the high probability of imminent and life threatening deterioration that could result in multi-organ failure.  It was exclusive of separately billable procedures and treating other patients and teaching time.     Standley Dakins, MD How to contact the [facility] Attending or Consulting provider 7A - 7P or covering provider during after hours 7P -7A, for this patient?  1. Check the care team in [facility] and look for a) attending/consulting TRH provider listed and b) the [facility] team listed 2. Log into [email or web address] and use [place]'s universal password  to access. If you do not have the password, please contact the hospital operator. 3. Locate the [facility] provider you are looking for under [facility] and page to a number that you can be directly reached. 4. If you still have difficulty reaching the provider, please page the [facility] (Director on Call) for the Hospitalists listed on amion for assistance.  04/14/2020, 10:45 AM

## 2020-04-14 NOTE — Plan of Care (Signed)
Palliative: Thank you for this consult. Unfortunately due to high volume of consults there will be a delay in a Palliative Provider seeing this patient. Palliative Medicine will return to service on 04/15/2020 and will see patient at that time.  Lillia Carmel, NP Palliative Medicine Please call Palliative Medicine team phone with any questions [phone]. For individual providers please see AMION. No charge.

## 2020-04-15 ENCOUNTER — Encounter (HOSPITAL_COMMUNITY): Payer: Self-pay | Admitting: Family Medicine

## 2020-04-15 DIAGNOSIS — I10 Essential (primary) hypertension: Secondary | ICD-10-CM | POA: Diagnosis not present

## 2020-04-15 DIAGNOSIS — J9601 Acute respiratory failure with hypoxia: Secondary | ICD-10-CM | POA: Diagnosis not present

## 2020-04-15 DIAGNOSIS — R7989 Other specified abnormal findings of blood chemistry: Secondary | ICD-10-CM | POA: Diagnosis not present

## 2020-04-15 DIAGNOSIS — Z7189 Other specified counseling: Secondary | ICD-10-CM

## 2020-04-15 DIAGNOSIS — U071 COVID-19: Secondary | ICD-10-CM | POA: Diagnosis not present

## 2020-04-15 DIAGNOSIS — Z515 Encounter for palliative care: Secondary | ICD-10-CM

## 2020-04-15 LAB — COMPREHENSIVE METABOLIC PANEL
ALT: 57 U/L — ABNORMAL HIGH (ref 0–44)
AST: 93 U/L — ABNORMAL HIGH (ref 15–41)
Albumin: 2.4 g/dL — ABNORMAL LOW (ref 3.5–5.0)
Alkaline Phosphatase: 281 U/L — ABNORMAL HIGH (ref 38–126)
Anion gap: 7 (ref 5–15)
BUN: 29 mg/dL — ABNORMAL HIGH (ref 8–23)
CO2: 23 mmol/L (ref 22–32)
Calcium: 7.9 mg/dL — ABNORMAL LOW (ref 8.9–10.3)
Chloride: 107 mmol/L (ref 98–111)
Creatinine, Ser: 1.13 mg/dL — ABNORMAL HIGH (ref 0.44–1.00)
GFR, Estimated: 53 mL/min — ABNORMAL LOW (ref >60)
Glucose, Bld: 142 mg/dL — ABNORMAL HIGH (ref 70–99)
Potassium: 4.1 mmol/L (ref 3.5–5.1)
Sodium: 137 mmol/L (ref 135–145)
Total Bilirubin: 1.2 mg/dL (ref 0.3–1.2)
Total Protein: 5.3 g/dL — ABNORMAL LOW (ref 6.5–8.1)

## 2020-04-15 LAB — CBC WITH DIFFERENTIAL/PLATELET
Abs Immature Granulocytes: 0.01 10*3/uL (ref 0.00–0.07)
Basophils Absolute: 0 10*3/uL (ref 0.0–0.1)
Basophils Relative: 0 %
Eosinophils Absolute: 0 10*3/uL (ref 0.0–0.5)
Eosinophils Relative: 0 %
HCT: 34.8 % — ABNORMAL LOW (ref 36.0–46.0)
Hemoglobin: 11.7 g/dL — ABNORMAL LOW (ref 12.0–15.0)
Immature Granulocytes: 0 %
Lymphocytes Relative: 11 %
Lymphs Abs: 0.3 10*3/uL — ABNORMAL LOW (ref 0.7–4.0)
MCH: 29.6 pg (ref 26.0–34.0)
MCHC: 33.6 g/dL (ref 30.0–36.0)
MCV: 88.1 fL (ref 80.0–100.0)
Monocytes Absolute: 0.2 10*3/uL (ref 0.1–1.0)
Monocytes Relative: 6 %
Neutro Abs: 2.4 10*3/uL (ref 1.7–7.7)
Neutrophils Relative %: 83 %
Platelets: 51 10*3/uL — ABNORMAL LOW (ref 150–400)
RBC: 3.95 MIL/uL (ref 3.87–5.11)
RDW: 15.4 % (ref 11.5–15.5)
WBC: 2.9 10*3/uL — ABNORMAL LOW (ref 4.0–10.5)
nRBC: 0 % (ref 0.0–0.2)

## 2020-04-15 LAB — C-REACTIVE PROTEIN: CRP: 2.5 mg/dL — ABNORMAL HIGH (ref <1.0)

## 2020-04-15 LAB — GLUCOSE, CAPILLARY
Glucose-Capillary: 124 mg/dL — ABNORMAL HIGH (ref 70–99)
Glucose-Capillary: 120 mg/dL — ABNORMAL HIGH (ref 70–99)
Glucose-Capillary: 123 mg/dL — ABNORMAL HIGH (ref 70–99)
Glucose-Capillary: 109 mg/dL — ABNORMAL HIGH (ref 70–99)

## 2020-04-15 LAB — D-DIMER, QUANTITATIVE: D-Dimer, Quant: 1.65 ug/mL-FEU — ABNORMAL HIGH (ref 0.00–0.50)

## 2020-04-15 LAB — HEPATITIS PANEL, ACUTE
HCV Ab: 0.3 s/co ratio — AB (ref 0.0–0.9)
Hep A IgM: NEGATIVE — AB
Hep B C IgM: NEGATIVE — AB
Hepatitis B Surface Ag: NEGATIVE — AB

## 2020-04-15 LAB — FERRITIN: Ferritin: 486 ng/mL — ABNORMAL HIGH (ref 11–307)

## 2020-04-15 LAB — STREP PNEUMONIAE URINARY ANTIGEN: Strep Pneumo Urinary Antigen: NEGATIVE

## 2020-04-15 MED ORDER — SODIUM CHLORIDE 0.9 % IV SOLN: INTRAVENOUS | Status: AC

## 2020-04-15 MED ORDER — INSULIN ASPART 100 UNIT/ML ~~LOC~~ SOLN
2.0000 [IU] | Freq: Three times a day (TID) | SUBCUTANEOUS | Status: DC
  Administered 2020-04-17 – 2020-04-24 (×23): 2 [IU] via SUBCUTANEOUS

## 2020-04-15 MED ORDER — LORAZEPAM 0.5 MG PO TABS
0.5000 mg | ORAL_TABLET | Freq: Four times a day (QID) | ORAL | Status: DC | PRN
  Administered 2020-04-15 – 2020-04-16 (×2): 1 mg via ORAL
  Administered 2020-04-16 – 2020-04-17 (×2): 0.5 mg via ORAL
  Administered 2020-04-18 – 2020-04-24 (×12): 1 mg via ORAL
  Filled 2020-04-15 (×14): qty 2
  Filled 2020-04-15 (×2): qty 1
  Filled 2020-04-15: qty 2

## 2020-04-15 NOTE — Consult Note (Signed)
Consultation Note Date: 04/15/2020   Patient Name: Angelica Wong  DOB: 1950/09/28  MRN: [id number]  Age / Sex: 69 y.o., female  PCP: Ocie Bob, FNP Referring Physician: Murlean Iba, MD  Reason for Consultation: Establishing goals of care  HPI/Patient Profile: 69 y.o. female  with past medical history of HTN, hypothyroid, morbid obesity admitted on 04/12/2020 with covid 19 PNE.   Clinical Assessment and Goals of Care:  I have reviewed medical records including EPIC notes, labs and imaging, received report from attending and bedside nursing staff, examined the patient and met at bedside with Angelica Wong to discuss diagnosis prognosis, GOC, EOL wishes, disposition and options.  Angelica Wong is Covid positive, and a negative pressure room in the ICU. She greets me as I enter, making and mostly keeping eye contact. She appears morbidly obese, but in no distress. She is alert and oriented, able to make her needs known. There is no family at bedside at this time.  I introduced Palliative Medicine as specialized medical care for people living with serious illness. It focuses on providing relief from the symptoms and stress of a serious illness.   We discussed a brief life review of the patient. Angelica Wong has been married to her husband, Angelica Wong, for 35 years. They have 3 children, 2 sons and 1 daughter.   As far as functional and nutritional status she has been independent with IADLs until this current illness.  We discussed her current illness and what it means in the larger context of her on-going co-morbidities.  Natural disease trajectory and expectations at EOL were discussed. We talked about the treatment plan for her Covid pneumonia. She is able to accurately tell me her oxygen needs. I encouraged her to continue moving. She seems quite motivated at this time.  Advanced directives, concepts specific  to code status, were considered and discussed. At this point, Angelica Wong shares that she would want life support. CPR and intubation. We talked about, "for how long?". She shares that she would not want to spend of her rest of her life on life support, but would accept at least 1 month. We talked about a trach at 2 weeks. She tells me that she would accept a trach/PEG.  I encourage Angelica Wong to continue moving, get out of bed if possible. We talked about short-term rehab. She shares that she would be agreeable to short-term rehab if qualified. We talked about time for outcomes.  Questions and concerns were addressed.  The family was encouraged to call with questions or concerns.   Conference with attending and bedside nursing staff related to patient condition, needs, goals of care.   HCPOA    NEXT OF KIN -spouse of 28 years, [name]    SUMMARY OF RECOMMENDATIONS   Continue full scope/full code Agreeable to short-term rehab if qualified Time for outcomes  Code Status/Advance Care Planning:  Full code  Symptom Management:   Per hospitalist, no additional needs at this time.  Palliative Prophylaxis:   Frequent  Pain Assessment and Oral Care  Additional Recommendations (Limitations, Scope, Preferences):  Full Scope Treatment  Psycho-social/Spiritual:   Desire for further Chaplaincy support:no  Additional Recommendations: Caregiving  Support/Resources  Prognosis:   Unable to determine, based on outcomes. 1 year or more anticipated if she is able to have recovery.  Discharge Planning: Agreeable to short-term rehab if qualified      Primary Diagnoses: Present on Admission: . Pneumonia due to COVID-19 virus . Acute respiratory failure with hypoxia ([place]) . Thrombocytopenia ([place]) . Obesity, Class III, BMI 40-49.9 (morbid obesity) ([place]) . Essential hypertension . Hypothyroidism . Elevated LFTs   I have reviewed the medical record, interviewed the patient and  family, and examined the patient. The following aspects are pertinent.  Past Medical History:  Diagnosis Date  . Anxiety   . Arthritis   . Sciatica   . Thyroid disease    Social History   Socioeconomic History  . Marital status: Married    Spouse name: Not on file  . Number of children: Not on file  . Years of education: Not on file  . Highest education level: Not on file  Occupational History  . Not on file  Tobacco Use  . Smoking status: Never Smoker  . Smokeless tobacco: Never Used  Vaping Use  . Vaping Use: Never used  Substance and Sexual Activity  . Alcohol use: No  . Drug use: Never  . Sexual activity: Not on file  Other Topics Concern  . Not on file  Social History Narrative  . Not on file   Social Determinants of Health   Financial Resource Strain:   . Difficulty of Paying Living Expenses: Not on file  Food Insecurity:   . Worried About Charity fundraiser in the Last Year: Not on file  . Ran Out of Food in the Last Year: Not on file  Transportation Needs:   . Lack of Transportation (Medical): Not on file  . Lack of Transportation (Non-Medical): Not on file  Physical Activity:   . Days of Exercise per Week: Not on file  . Minutes of Exercise per Session: Not on file  Stress:   . Feeling of Stress : Not on file  Social Connections:   . Frequency of Communication with Friends and Family: Not on file  . Frequency of Social Gatherings with Friends and Family: Not on file  . Attends Religious Services: Not on file  . Active Member of Clubs or Organizations: Not on file  . Attends Archivist Meetings: Not on file  . Marital Status: Not on file   History reviewed. No pertinent family history. Scheduled Meds: . albuterol  2 puff Inhalation Q6H  . baricitinib  4 mg Oral Daily  . Chlorhexidine Gluconate Cloth  6 each Topical Daily  . DULoxetine  60 mg Oral Daily  . insulin aspart  0-20 Units Subcutaneous TID WC  . insulin aspart  0-5 Units  Subcutaneous QHS  . insulin aspart  2 Units Subcutaneous TID WC  . levothyroxine  112 mcg Oral Q0600  . metoprolol succinate  25 mg Oral Daily  . montelukast  10 mg Oral QHS  . predniSONE  50 mg Oral Daily   Continuous Infusions: . sodium chloride 65 mL/hr at 04/14/20 1815  . levofloxacin (LEVAQUIN) IV 750 mg (04/15/20 6378)  . remdesivir 100 mg in NS 100 mL Stopped (04/14/20 1202)   PRN Meds:.acetaminophen, chlorpheniramine-HYDROcodone, cyclobenzaprine, guaiFENesin-dextromethorphan, LORazepam, melatonin, ondansetron **OR** ondansetron (ZOFRAN) IV, traMADol Medications  Prior to Admission:  Prior to Admission medications   Medication Sig Start Date End Date Taking? Authorizing Provider  albuterol (VENTOLIN HFA) 108 (90 Base) MCG/ACT inhaler Inhale 1 puff into the lungs every 6 (six) hours as needed for wheezing or shortness of breath.  02/16/20  Yes [provider]  ascorbic acid (VITAMIN C) 1000 MG tablet Take by mouth. 11/09/17  Yes [provider]  cyclobenzaprine (FLEXERIL) 5 MG tablet Take 5 mg by mouth 2 (two) times daily.   Yes [provider]  DULoxetine (CYMBALTA) 60 MG capsule Take 60 mg by mouth daily.   Yes [provider]  folic acid (FOLVITE) 1 MG tablet Take 1 mg by mouth daily.   Yes [provider]  Levothyroxine Sodium 112 MCG CAPS Take 112 mcg by mouth daily.    Yes [provider]  LORazepam (ATIVAN) 0.5 MG tablet Take 0.5 mg by mouth 2 (two) times daily. 2 tablets at bedtime   Yes [provider]  metoprolol succinate (TOPROL-XL) 25 MG 24 hr tablet Take 25 mg by mouth daily. 01/22/20  Yes [provider]  montelukast (SINGULAIR) 10 MG tablet Take 10 mg by mouth daily. 01/29/20  Yes [provider]  Multiple Vitamin (MULTIVITAMIN) tablet Take 1 tablet by mouth daily.   Yes [provider]  predniSONE (DELTASONE) 20 MG tablet Take 20 mg by mouth 2 (two) times daily.  03/13/20  Yes  [provider]  simethicone (GAS-X) 80 MG chewable tablet Chew 1 tablet (80 mg total) by mouth every 6 (six) hours as needed for flatulence. 06/21/17  Yes Julianne Rice, MD  traMADol (ULTRAM) 50 MG tablet Take by mouth every 12 (twelve) hours as needed.   Yes [provider]  Vitamin D, Ergocalciferol, (DRISDOL) 50000 UNITS CAPS Take 50,000 Units by mouth every 7 (seven) days. Fridays   Yes [provider]   Allergies  Allergen Reactions  . Wellbutrin [Bupropion Hcl] Other (See Comments)    Uncontrollable Crying   Review of Systems  Unable to perform ROS: Acuity of condition    Physical Exam Vitals and nursing note reviewed.     Vital Signs: BP 118/73   Pulse 66   Temp 98 F (36.7 C) (Oral)   Resp (!) 22   Ht _0  (1.651 m)   Wt 122.4 kg   SpO2 93%   BMI 44.90 kg/m  Pain Scale: 0-10   Pain Score: 5    SpO2: SpO2: 93 % O2 Device:SpO2: 93 % O2 Flow Rate: .O2 Flow Rate (L/min): 10 L/min  IO: Intake/output summary:   Intake/Output Summary (Last 24 hours) at 04/15/2020 0910 Last data filed at 04/14/2020 2200 Gross per 24 hour  Intake 1063.53 ml  Output 750 ml  Net 313.53 ml    LBM: Last BM Date: 04/13/20 Baseline Weight: Weight: 122.5 kg Most recent weight: Weight: 122.4 kg     Palliative Assessment/Data:   Flowsheet Rows     Most Recent Value  Intake Tab  Referral Department Hospitalist  Unit at Time of Referral Intermediate Care Unit  Palliative Care Primary Diagnosis Pulmonary  Date Notified 04/13/20  Palliative Care Type New Palliative care  Date of Admission 04/12/20  Date first seen by Palliative Care 04/15/20  # of days Palliative referral response time 2 Day(s)  # of days IP prior to Palliative referral 1  Clinical Assessment  Palliative Performance Scale Score 50%  Pain Max last 24 hours Not able to report  Pain Min Last 24 hours Not able to report  Dyspnea Max Last 24 Hours Not able to report  Dyspnea Min Last  24 hours Not able to report  Psychosocial & Spiritual Assessment  Palliative Care Outcomes      Time In: 0920 Time Out: 1030 Time Total: 70 minutes  Greater than 50%  of this time was spent counseling and coordinating care related to the above assessment and plan.  Signed by: Drue Novel, NP   Please contact Palliative Medicine Team phone at [phone] for questions and concerns.  For individual provider: See Shea Evans

## 2020-04-15 NOTE — Progress Notes (Signed)
PROGRESS NOTE   Angelica Wong  UXL:[id number] DOB: 26-Jan-1951 DOA: 04/12/2020 PCP: Lenoria Chime, FNP   Chief Complaint  Patient presents with  . Shortness of Breath   Brief Admission History:  69 y.o. female with medical history significant of hypertension, hypothyroidism, morbid obesity, was recently diagnosed with COVID-19 approximately 10 days ago.  She was having fever, cough, malaise.  She was seen on Monday in the emergency room and at that time she was not hypoxic.  She received a monoclonal antibody infusion the following day on 11/9.  She reports over the past 3 days she has had worsening shortness of breath.  She is also had frequent diarrhea, abdominal cramping and nausea.  She has not had any vomiting.  She continues to have fevers.  Her husband also has COVID-19, but overall is doing better than she is.  She is unvaccinated.    Assessment & Plan:   Active Problems:   Pneumonia due to COVID-19 virus   Acute respiratory failure with hypoxia (HCC)   Thrombocytopenia (HCC)   Obesity, Class III, BMI 40-49.9 (morbid obesity) (H[facility])   Essential hypertension   Hypothyroidism   Elevated LFTs  1. Covid Pneumonia - Pt likely has extensive parenchymal damage given rapid increase in oxygen requirements. Fortunately her oxygen requirement has now worsened and slightly improved today. Continue aggressive measures and continue baricitinib.  Continue remdesivir, steroids and supportive care.  Following inflammatory markers which are trending down.  She is s/p monoclonal antibody infusion on 11/9.  Encouraged more proning activity.  2. AKI - I suspect from bladder outlet obstruction and urinary retention, will need to place foley as she continues to require I/O caths after 24 hours.  3. CAP - with elevated procalcitonin continue levofloxacin IV to cover super-imposed bacterial infections. Follow up strep pneumo test was negative.  4. Essential hypertension - resumed home metoprolol.   5. Elevated LFTs - thought secondary to hepatic steatosis, holding stable so far on remdesivir, following closely.  6. Hypothyroidism - resumed home levothyroxine.  7. Morbid obesity - stable.  8. Advanced care planning - palliative care team consulted.   DVT prophylaxis: SCDs Code Status:  Full  Family Communication: t/c to husband 11/14, 11/16 Disposition: Home when medically stable   Status is: Inpatient  Remains inpatient appropriate because:IV treatments appropriate due to intensity of illness or inability to take PO and Inpatient level of care appropriate due to severity of illness  Dispo: The patient is from: Home              Anticipated d/c is to: Home              Anticipated d/c date is: > 3 days              Patient currently is not medically stable to d/c.  Consultants:     Procedures:   Antimicrobials:  Levofloxacin 11/14>>  Subjective: Pt says there has been no change.  Her breathing does not feel to be getting worse.  Mostly nonproductive cough.   Objective: Vitals:   04/15/20 1000 04/15/20 1040 04/15/20 1045 04/15/20 1100  BP: 90/75   (!) 116/43  Pulse: 68 73 70 69  Resp: (!) 21 (!) 28 (!) 25 20  Temp:      TempSrc:      SpO2: 94% (!) 79% (!) 84% (!) 88%  Weight:      Height:        Intake/Output Summary (Last 24 hours) at 04/15/2020  1206 Last data filed at 04/15/2020 1100 Gross per 24 hour  Intake 2137.52 ml  Output 1250 ml  Net 887.52 ml   Filed Weights   04/12/20 0619 04/14/20 0003  Weight: 122.5 kg 122.4 kg    Examination:  General exam: awake, speaking full sentences, oriented x 3, Appears calm and comfortable on 15L/m [place] Respiratory system: diffuse rales heard bilaterally.  Moderate increased work of breathing. Cardiovascular system: normal S1 & S2 heard. No JVD, murmurs, rubs, gallops or clicks. 1+ pedal edema. Gastrointestinal system: Abdomen is obese, nondistended, soft and nontender. No organomegaly or masses felt. Normal bowel  sounds heard. Central nervous system: Alert and oriented. No focal neurological deficits. Extremities: Symmetric 5 x 5 power. Skin: No rashes, lesions or ulcers Psychiatry: Judgement and insight appear normal. Mood & affect appropriate.   Data Reviewed: I have personally reviewed following labs and imaging studies  CBC: Recent Labs  Lab 04/12/20 0640 04/13/20 0456 04/14/20 0556 04/15/20 0520  WBC 3.1* 1.7* 2.9* 2.9*  NEUTROABS 1.9 1.3* 2.2 2.4  HGB 13.8 14.0 12.0 11.7*  HCT 41.9 42.0 36.8 34.8*  MCV 88.6 88.1 88.0 88.1  PLT 53* 59* 55* 51*    Basic Metabolic Panel: Recent Labs  Lab 04/12/20 0640 04/13/20 0456 04/14/20 0556 04/15/20 0520  NA 137 137 135 137  K 4.3 4.9 4.5 4.1  CL 105 104 104 107  CO2 25 23 24 23   GLUCOSE 107* 158* 130* 142*  BUN 17 20 29* 29*  CREATININE 1.03* 0.93 1.05* 1.13*  CALCIUM 7.9* 8.3* 8.0* 7.9*    GFR: Estimated Creatinine Clearance: 63.5 mL/min (A) (by C-G formula based on SCr of 1.13 mg/dL (H)).  Liver Function Tests: Recent Labs  Lab 04/12/20 0640 04/13/20 0456 04/14/20 0556 04/15/20 0520  AST 246* 194* 109* 93*  ALT 95* 92* 63* 57*  ALKPHOS 368* 389* 289* 281*  BILITOT 1.7* 1.7* 1.2 1.2  PROT 6.3* 7.0 5.5* 5.3*  ALBUMIN 2.9* 3.0* 2.5* 2.4*    CBG: Recent Labs  Lab 04/14/20 0850 04/14/20 1127 04/14/20 1653 04/14/20 2244 04/15/20 0803  GLUCAP 129* 96 115* 128* 124*    Recent Results (from the past 240 hour(s))  MRSA PCR Screening     Status: None   Collection Time: 04/13/20 11:50 PM   Specimen: Nasopharyngeal  Result Value Ref Range Status   MRSA by PCR NEGATIVE NEGATIVE Final    Comment:        The GeneXpert MRSA Assay (FDA approved for NASAL specimens only), is one component of a comprehensive MRSA colonization surveillance program. It is not intended to diagnose MRSA infection nor to guide or monitor treatment for MRSA infections. Performed at [facility], [address], [place], [place]       Radiology Studies: No results found. Scheduled Meds: . albuterol  2 puff Inhalation Q6H  . baricitinib  4 mg Oral Daily  . Chlorhexidine Gluconate Cloth  6 each Topical Daily  . DULoxetine  60 mg Oral Daily  . insulin aspart  0-20 Units Subcutaneous TID WC  . insulin aspart  0-5 Units Subcutaneous QHS  . insulin aspart  2 Units Subcutaneous TID WC  . levothyroxine  112 mcg Oral Q0600  . metoprolol succinate  25 mg Oral Daily  . montelukast  10 mg Oral QHS  . predniSONE  50 mg Oral Daily   Continuous Infusions: . levofloxacin (LEVAQUIN) IV Stopped (04/15/20 04/17/20)  . remdesivir 100 mg in NS 100 mL 200 mL/hr  at 04/15/20 1100     LOS: 3 days   Critical Care Procedure Note Authorized and Performed by: Maryln Manuel MD  Total Critical Care time:  31 mins Due to a high probability of clinically significant, life threatening deterioration, the patient required my highest level of preparedness to intervene emergently and I personally spent this critical care time directly and personally managing the patient.  This critical care time included obtaining a history; examining the patient, pulse oximetry; ordering and review of studies; arranging urgent treatment with development of a management plan; evaluation of patient's response of treatment; frequent reassessment; and discussions with other providers.  This critical care time was performed to assess and manage the high probability of imminent and life threatening deterioration that could result in multi-organ failure.  It was exclusive of separately billable procedures and treating other patients and teaching time.    Standley Dakins, MD How to contact the [facility] Attending or Consulting provider 7A - 7P or covering provider during after hours 7P -7A, for this patient?  1. Check the care team in [facility] and look for a) attending/consulting [facility] provider listed and b) the [facility] team listed 2. Log into [email or web address] and use [place]'s universal  password to access. If you do not have the password, please contact the hospital operator. 3. Locate the [facility] provider you are looking for under [facility] and page to a number that you can be directly reached. 4. If you still have difficulty reaching the provider, please page the [facility] (Director on Call) for the Hospitalists listed on amion for assistance.  04/15/2020, 12:06 PM

## 2020-04-16 ENCOUNTER — Inpatient Hospital Stay (HOSPITAL_COMMUNITY): Payer: Medicare Other

## 2020-04-16 DIAGNOSIS — J9601 Acute respiratory failure with hypoxia: Secondary | ICD-10-CM | POA: Diagnosis not present

## 2020-04-16 DIAGNOSIS — U071 COVID-19: Secondary | ICD-10-CM | POA: Diagnosis not present

## 2020-04-16 DIAGNOSIS — I10 Essential (primary) hypertension: Secondary | ICD-10-CM | POA: Diagnosis not present

## 2020-04-16 DIAGNOSIS — R7989 Other specified abnormal findings of blood chemistry: Secondary | ICD-10-CM | POA: Diagnosis not present

## 2020-04-16 LAB — CBC WITH DIFFERENTIAL/PLATELET
Abs Immature Granulocytes: 0.03 10*3/uL (ref 0.00–0.07)
Basophils Absolute: 0 10*3/uL (ref 0.0–0.1)
Basophils Relative: 0 %
Eosinophils Absolute: 0 10*3/uL (ref 0.0–0.5)
Eosinophils Relative: 0 %
HCT: 40.3 % (ref 36.0–46.0)
Hemoglobin: 13.1 g/dL (ref 12.0–15.0)
Immature Granulocytes: 1 %
Lymphocytes Relative: 11 %
Lymphs Abs: 0.4 10*3/uL — ABNORMAL LOW (ref 0.7–4.0)
MCH: 29 pg (ref 26.0–34.0)
MCHC: 32.5 g/dL (ref 30.0–36.0)
MCV: 89.4 fL (ref 80.0–100.0)
Monocytes Absolute: 0.4 10*3/uL (ref 0.1–1.0)
Monocytes Relative: 9 %
Neutro Abs: 3.2 10*3/uL (ref 1.7–7.7)
Neutrophils Relative %: 79 %
Platelets: 49 10*3/uL — ABNORMAL LOW (ref 150–400)
RBC: 4.51 MIL/uL (ref 3.87–5.11)
RDW: 15.7 % — ABNORMAL HIGH (ref 11.5–15.5)
WBC: 4.1 10*3/uL (ref 4.0–10.5)
nRBC: 0 % (ref 0.0–0.2)

## 2020-04-16 LAB — COMPREHENSIVE METABOLIC PANEL
ALT: 71 U/L — ABNORMAL HIGH (ref 0–44)
AST: 115 U/L — ABNORMAL HIGH (ref 15–41)
Albumin: 2.7 g/dL — ABNORMAL LOW (ref 3.5–5.0)
Alkaline Phosphatase: 308 U/L — ABNORMAL HIGH (ref 38–126)
Anion gap: 7 (ref 5–15)
BUN: 28 mg/dL — ABNORMAL HIGH (ref 8–23)
CO2: 24 mmol/L (ref 22–32)
Calcium: 8.2 mg/dL — ABNORMAL LOW (ref 8.9–10.3)
Chloride: 108 mmol/L (ref 98–111)
Creatinine, Ser: 1.09 mg/dL — ABNORMAL HIGH (ref 0.44–1.00)
GFR, Estimated: 56 mL/min — ABNORMAL LOW (ref >60)
Glucose, Bld: 84 mg/dL (ref 70–99)
Potassium: 3.7 mmol/L (ref 3.5–5.1)
Sodium: 139 mmol/L (ref 135–145)
Total Bilirubin: 1.7 mg/dL — ABNORMAL HIGH (ref 0.3–1.2)
Total Protein: 6.2 g/dL — ABNORMAL LOW (ref 6.5–8.1)

## 2020-04-16 LAB — GLUCOSE, CAPILLARY
Glucose-Capillary: 63 mg/dL — ABNORMAL LOW (ref 70–99)
Glucose-Capillary: 64 mg/dL — ABNORMAL LOW (ref 70–99)
Glucose-Capillary: 102 mg/dL — ABNORMAL HIGH (ref 70–99)
Glucose-Capillary: 127 mg/dL — ABNORMAL HIGH (ref 70–99)
Glucose-Capillary: 147 mg/dL — ABNORMAL HIGH (ref 70–99)
Glucose-Capillary: 140 mg/dL — ABNORMAL HIGH (ref 70–99)

## 2020-04-16 LAB — D-DIMER, QUANTITATIVE: D-Dimer, Quant: 2.96 ug/mL-FEU — ABNORMAL HIGH (ref 0.00–0.50)

## 2020-04-16 LAB — LEGIONELLA PNEUMOPHILA SEROGP 1 UR AG: L. pneumophila Serogp 1 Ur Ag: NEGATIVE

## 2020-04-16 LAB — FERRITIN: Ferritin: 460 ng/mL — ABNORMAL HIGH (ref 11–307)

## 2020-04-16 LAB — C-REACTIVE PROTEIN: CRP: 1.8 mg/dL — ABNORMAL HIGH (ref <1.0)

## 2020-04-16 MED ORDER — IOHEXOL 350 MG/ML SOLN
100.0000 mL | Freq: Once | INTRAVENOUS | Status: AC | PRN
  Administered 2020-04-16: 100 mL via INTRAVENOUS

## 2020-04-16 MED ORDER — METHYLPREDNISOLONE SODIUM SUCC 125 MG IJ SOLR
60.0000 mg | Freq: Once | INTRAMUSCULAR | Status: AC
  Administered 2020-04-16: 60 mg via INTRAVENOUS
  Filled 2020-04-16: qty 2

## 2020-04-16 MED ORDER — METHYLPREDNISOLONE SODIUM SUCC 125 MG IJ SOLR
125.0000 mg | Freq: Two times a day (BID) | INTRAMUSCULAR | Status: DC
  Administered 2020-04-16 – 2020-04-24 (×16): 125 mg via INTRAVENOUS
  Filled 2020-04-16 (×16): qty 2

## 2020-04-16 NOTE — Plan of Care (Signed)
  Problem: Education: Goal: Knowledge of risk factors and measures for prevention of condition will improve Outcome: Progressing   Problem: Respiratory: Goal: Will maintain a patent airway Outcome: Progressing Goal: Complications related to the disease process, condition or treatment will be avoided or minimized Outcome: Progressing   

## 2020-04-16 NOTE — Progress Notes (Signed)
Hypoglycemic Event  CBG: 63  Treatment: 8 oz juice/soda  Symptoms: None  Follow-up CBG: Time: 0913, 1023  CBG Result:64, 102  Possible Reasons for Event: Inadequate meal intake  Comments/MD notified: pt ate breakfast and rechecked when completely done.     Marcelino Scot

## 2020-04-16 NOTE — Progress Notes (Signed)
PROGRESS NOTE  Angelica Wong JKK:[id number] DOB: 01/14/1951 DOA: 04/12/2020 PCP: Lenoria Chime, FNP  Brief History:   69 y.o. female with medical history significant of hypertension, hypothyroidism, morbid obesity, was recently diagnosed with COVID-19 on 11/3 /21.  She had a positive test at a local drugstore. She was seen on 04/07/20 in the emergency room and at that time she was not hypoxic. She received a monoclonal antibody infusion the following day on 11/9. She reports over the past 3 days she has had worsening shortness of breath. She is also had frequent diarrhea, abdominal cramping and nausea. She has not had any vomiting. She continues to have fevers. Her husband also has COVID-19, but overall is doing better than she is. She is unvaccinated.  The patient was admitted and started on IV solumedrol and remdesivir.  Assessment/Plan: Acute respiratory failure with hypoxia secondary COVID-19 pneumonia -Patient was requiring oxygen up to 13 L HFNC -Continue remdesivir -Change steroids back to IV -Ferritin 1272>> 729>> 588>> 486>> 460 -CRP 5.9>> 7.4>> 4.5>> 2.5>> 1.8 -D-dimer 1.71>> 1.87>> 1.41>> 1.65>> 2.96 -CTA chest -PCT 0.48 -Finish 5 days levofloxacin which was started secondary to elevated PCT -OOB each shift -incentive spirometry  Thrombocytopenia -Appears to be chronic -Patient has been evaluated at hematology clinic in [place]  -Check B12 -Check folic acid -Acute worsening secondary to infection  Hypothyroidism -Continue Synthroid  Hyperglycemia -Due to steroids -Continue NovoLog sliding scale -Continue reduced dose Lantus -04/13/2020 hemoglobin A1c 5.6  Transaminasemia -Secondary to COVID-19 infection -Overall trending down  Essential hypertension -Continue metoprolol succinate  Depression/anxiety -Continue Cymbalta  Morbid Obesity -BMI 44.90 -lifestyle modification    Status is: Inpatient  Remains inpatient appropriate  because:Hemodynamically unstable and IV treatments appropriate due to intensity of illness or inability to take PO   Dispo: The patient is from: Home              Anticipated d/c is to: Home              Anticipated d/c date is: 3 days              Patient currently is not medically stable to d/c.        Family Communication:   No Family at bedside  Consultants:  palliative  Code Status:  FULL   DVT Prophylaxis:  SCDs   Procedures: As Listed in Progress Note Above  Antibiotics: None      Subjective: Patient continues to have sob with minimal exertion.  Denies cp, n/v/d, abd pain, f/c.  She has dry cough  Objective: Vitals:   04/16/20 0900 04/16/20 0925 04/16/20 1000 04/16/20 1100  BP: (!) 151/64  (!) 111/58 (!) 114/46  Pulse: 72 76 73 71  Resp: (!) 31 (!) 25 (!) 23   Temp:      TempSrc:      SpO2: 90% 93% 93% 97%  Weight:      Height:        Intake/Output Summary (Last 24 hours) at 04/16/2020 1135 Last data filed at 04/16/2020 1100 Gross per 24 hour  Intake 1775.72 ml  Output 400 ml  Net 1375.72 ml   Weight change:  Exam:   General:  Pt is alert, follows commands appropriately, not in acute distress  HEENT: No icterus, No thrush, No neck mass, [place]/AT  Cardiovascular: RRR, S1/S2, no rubs, no gallops  Respiratory: bilateral crackles. No wheeze  Abdomen: Soft/+BS, non tender, non distended, no  guarding  Extremities: nonpitting edema, No lymphangitis, No petechiae, No rashes, no synovitis   Data Reviewed: I have personally reviewed following labs and imaging studies Basic Metabolic Panel: Recent Labs  Lab 04/12/20 0640 04/13/20 0456 04/14/20 0556 04/15/20 0520 04/16/20 0517  NA 137 137 135 137 139  K 4.3 4.9 4.5 4.1 3.7  CL 105 104 104 107 108  CO2 25 23 24 23 24   GLUCOSE 107* 158* 130* 142* 84  BUN 17 20 29* 29* 28*  CREATININE 1.03* 0.93 1.05* 1.13* 1.09*  CALCIUM 7.9* 8.3* 8.0* 7.9* 8.2*   Liver Function Tests: Recent Labs    Lab 04/12/20 0640 04/13/20 0456 04/14/20 0556 04/15/20 0520 04/16/20 0517  AST 246* 194* 109* 93* 115*  ALT 95* 92* 63* 57* 71*  ALKPHOS 368* 389* 289* 281* 308*  BILITOT 1.7* 1.7* 1.2 1.2 1.7*  PROT 6.3* 7.0 5.5* 5.3* 6.2*  ALBUMIN 2.9* 3.0* 2.5* 2.4* 2.7*   No results for input(s): LIPASE, AMYLASE in the last 168 hours. No results for input(s): AMMONIA in the last 168 hours. Coagulation Profile: Recent Labs  Lab 04/12/20 0640  INR 0.9   CBC: Recent Labs  Lab 04/12/20 0640 04/13/20 0456 04/14/20 0556 04/15/20 0520 04/16/20 0517  WBC 3.1* 1.7* 2.9* 2.9* 4.1  NEUTROABS 1.9 1.3* 2.2 2.4 3.2  HGB 13.8 14.0 12.0 11.7* 13.1  HCT 41.9 42.0 36.8 34.8* 40.3  MCV 88.6 88.1 88.0 88.1 89.4  PLT 53* 59* 55* 51* 49*   Cardiac Enzymes: No results for input(s): CKTOTAL, CKMB, CKMBINDEX, TROPONINI in the last 168 hours. BNP: Invalid input(s): POCBNP CBG: Recent Labs  Lab 04/15/20 1738 04/15/20 2128 04/16/20 0900 04/16/20 0913 04/16/20 1023  GLUCAP 123* 109* 63* 64* 102*   HbA1C: No results for input(s): HGBA1C in the last 72 hours. Urine analysis:    Component Value Date/Time   COLORURINE YELLOW 08/05/2011 1107   APPEARANCEUR CLEAR 08/05/2011 1107   LABSPEC 1.025 08/05/2011 1107   PHURINE 6.0 08/05/2011 1107   GLUCOSEU NEGATIVE 08/05/2011 1107   HGBUR NEGATIVE 08/05/2011 1107   BILIRUBINUR NEGATIVE 08/05/2011 1107   KETONESUR NEGATIVE 08/05/2011 1107   PROTEINUR NEGATIVE 08/05/2011 1107   UROBILINOGEN 0.2 08/05/2011 1107   NITRITE NEGATIVE 08/05/2011 1107   LEUKOCYTESUR NEGATIVE 08/05/2011 1107   Sepsis Labs: @LABRCNTIP (procalcitonin:4,lacticidven:4) ) Recent Results (from the past 240 hour(s))  MRSA PCR Screening     Status: None   Collection Time: 04/13/20 11:50 PM   Specimen: Nasopharyngeal  Result Value Ref Range Status   MRSA by PCR NEGATIVE NEGATIVE Final    Comment:        The GeneXpert MRSA Assay (FDA approved for NASAL specimens only), is one  component of a comprehensive MRSA colonization surveillance program. It is not intended to diagnose MRSA infection nor to guide or monitor treatment for MRSA infections. Performed at [facility], [address], [place], [address] [place]      Scheduled Meds: . albuterol  2 puff Inhalation Q6H  . baricitinib  4 mg Oral Daily  . Chlorhexidine Gluconate Cloth  6 each Topical Daily  . DULoxetine  60 mg Oral Daily  . insulin aspart  0-20 Units Subcutaneous TID WC  . insulin aspart  0-5 Units Subcutaneous QHS  . insulin aspart  2 Units Subcutaneous TID WC  . levothyroxine  112 mcg Oral Q0600  . methylPREDNISolone (SOLU-MEDROL) injection  125 mg Intravenous Q12H  . methylPREDNISolone (SOLU-MEDROL) injection  60 mg Intravenous Once  . metoprolol succinate  25 mg Oral Daily  . montelukast  10 mg Oral QHS   Continuous Infusions: . sodium chloride 65 mL/hr at 04/16/20 1100  . levofloxacin (LEVAQUIN) IV Stopped (04/16/20 1035)  . remdesivir 100 mg in NS 100 mL Stopped (04/15/20 1108)    Procedures/Studies: DG Chest Port 1 View  Result Date: 04/12/2020 CLINICAL DATA:  COVID positive. EXAM: PORTABLE CHEST 1 VIEW COMPARISON:  04/07/2020 FINDINGS: 0649 hours. Lungs are hyperexpanded. Interval progression of patchy peripheral ground-glass airspace opacity compatible with multifocal pneumonia. The cardio pericardial silhouette is enlarged. The visualized bony structures of the thorax show no acute abnormality. Telemetry leads overlie the chest. IMPRESSION: Progression of peripherally predominant patchy ground-glass attenuation consistent with multifocal pneumonia. Electronically Signed   By: Kennith Center M.D.   On: 04/12/2020 07:26   DG Chest Portable 1 View  Result Date: 04/07/2020 CLINICAL DATA:  Cough shortness of breath COVID positive EXAM: PORTABLE CHEST 1 VIEW COMPARISON:  None. FINDINGS: The heart size and mediastinal contours are within normal limits. Hazy patchy airspace opacity seen  within the right mid lung and bilateral lung bases. Aortic calcifications are seen. The visualized skeletal structures are unremarkable. IMPRESSION: Patchy airspace opacity seen within both lungs which be due to infectious etiology. Electronically Signed   By: Jonna Clark M.D.   On: 04/07/2020 18:46   US Abdomen Limited RUQ (LIVER/GB)  Result Date: 04/12/2020 CLINICAL DATA:  Elevated LFTs. EXAM: ULTRASOUND ABDOMEN LIMITED RIGHT UPPER QUADRANT COMPARISON:  None. FINDINGS: Gallbladder: No gallstones or gallbladder wall thickening. No pericholecystic fluid. The sonographer reports no sonographic Murphy's sign. Common bile duct: Diameter: 11 mm Liver: Liver parenchyma is echogenic and heterogeneous. Portal vein is patent on color Doppler imaging with normal direction of blood flow towards the liver. Other: None. IMPRESSION: Extrahepatic biliary duct dilatation. MR I/MRCP could be used to further evaluate as clinically warranted. Heterogeneous echogenic liver, potentially related to steatosis. Electronically Signed   By: Kennith Center M.D.   On: 04/12/2020 11:27    Catarina Hartshorn, DO  [facility]  If 7PM-7AM, please contact night-coverage [email or web address] Password [email or web address] 04/16/2020, 11:35 AM   LOS: 4 days

## 2020-04-17 DIAGNOSIS — R7989 Other specified abnormal findings of blood chemistry: Secondary | ICD-10-CM | POA: Diagnosis not present

## 2020-04-17 DIAGNOSIS — U071 COVID-19: Secondary | ICD-10-CM | POA: Diagnosis not present

## 2020-04-17 DIAGNOSIS — J9601 Acute respiratory failure with hypoxia: Secondary | ICD-10-CM | POA: Diagnosis not present

## 2020-04-17 DIAGNOSIS — Z7189 Other specified counseling: Secondary | ICD-10-CM | POA: Diagnosis not present

## 2020-04-17 LAB — COMPREHENSIVE METABOLIC PANEL
ALT: 70 U/L — ABNORMAL HIGH (ref 0–44)
AST: 96 U/L — ABNORMAL HIGH (ref 15–41)
Albumin: 2.4 g/dL — ABNORMAL LOW (ref 3.5–5.0)
Alkaline Phosphatase: 259 U/L — ABNORMAL HIGH (ref 38–126)
Anion gap: 5 (ref 5–15)
BUN: 23 mg/dL (ref 8–23)
CO2: 23 mmol/L (ref 22–32)
Calcium: 7.8 mg/dL — ABNORMAL LOW (ref 8.9–10.3)
Chloride: 107 mmol/L (ref 98–111)
Creatinine, Ser: 0.86 mg/dL (ref 0.44–1.00)
GFR, Estimated: >60 mL/min (ref >60)
Glucose, Bld: 146 mg/dL — ABNORMAL HIGH (ref 70–99)
Potassium: 4.1 mmol/L (ref 3.5–5.1)
Sodium: 135 mmol/L (ref 135–145)
Total Bilirubin: 1.7 mg/dL — ABNORMAL HIGH (ref 0.3–1.2)
Total Protein: 5.5 g/dL — ABNORMAL LOW (ref 6.5–8.1)

## 2020-04-17 LAB — CBC WITH DIFFERENTIAL/PLATELET
Abs Immature Granulocytes: 0.08 10*3/uL — ABNORMAL HIGH (ref 0.00–0.07)
Basophils Absolute: 0 10*3/uL (ref 0.0–0.1)
Basophils Relative: 0 %
Eosinophils Absolute: 0 10*3/uL (ref 0.0–0.5)
Eosinophils Relative: 0 %
HCT: 36.6 % (ref 36.0–46.0)
Hemoglobin: 12.1 g/dL (ref 12.0–15.0)
Immature Granulocytes: 5 %
Lymphocytes Relative: 9 %
Lymphs Abs: 0.2 10*3/uL — ABNORMAL LOW (ref 0.7–4.0)
MCH: 28.9 pg (ref 26.0–34.0)
MCHC: 33.1 g/dL (ref 30.0–36.0)
MCV: 87.4 fL (ref 80.0–100.0)
Monocytes Absolute: 0.1 10*3/uL (ref 0.1–1.0)
Monocytes Relative: 5 %
Neutro Abs: 1.4 10*3/uL — ABNORMAL LOW (ref 1.7–7.7)
Neutrophils Relative %: 81 %
Platelets: 43 10*3/uL — ABNORMAL LOW (ref 150–400)
RBC: 4.19 MIL/uL (ref 3.87–5.11)
RDW: 15.4 % (ref 11.5–15.5)
WBC: 1.8 10*3/uL — ABNORMAL LOW (ref 4.0–10.5)
nRBC: 0 % (ref 0.0–0.2)

## 2020-04-17 LAB — FIBRINOGEN: Fibrinogen: 193 mg/dL — ABNORMAL LOW (ref 210–475)

## 2020-04-17 LAB — GLUCOSE, CAPILLARY
Glucose-Capillary: 118 mg/dL — ABNORMAL HIGH (ref 70–99)
Glucose-Capillary: 124 mg/dL — ABNORMAL HIGH (ref 70–99)
Glucose-Capillary: 141 mg/dL — ABNORMAL HIGH (ref 70–99)
Glucose-Capillary: 141 mg/dL — ABNORMAL HIGH (ref 70–99)
Glucose-Capillary: 148 mg/dL — ABNORMAL HIGH (ref 70–99)

## 2020-04-17 LAB — C-REACTIVE PROTEIN: CRP: 5.2 mg/dL — ABNORMAL HIGH (ref <1.0)

## 2020-04-17 LAB — VITAMIN B12: Vitamin B-12: 1650 pg/mL — ABNORMAL HIGH (ref 180–914)

## 2020-04-17 LAB — FERRITIN: Ferritin: 440 ng/mL — ABNORMAL HIGH (ref 11–307)

## 2020-04-17 LAB — D-DIMER, QUANTITATIVE: D-Dimer, Quant: 3.93 ug/mL-FEU — ABNORMAL HIGH (ref 0.00–0.50)

## 2020-04-17 LAB — FOLATE: Folate: 11.8 ng/mL (ref >5.9)

## 2020-04-17 NOTE — Progress Notes (Signed)
5:57am  RN states that patient's O2 sat decreased to 85-86% while on HFNC 15 L, NRB was added to HFNC with improvement in O2 sats to 93%.  Patient was in no acute distress.

## 2020-04-17 NOTE — Progress Notes (Signed)
Patient is sleeping and resting well. However, her SBP has been greater than 160. Also, O2 Sat dropped to 80s with 12L HF.   Patient is on 15L HF with Non-breather mask - 92-93% O2 sat noted. patient denies any discomfort at this time. Patient doesn't look anxious. Respiration rate is 19. Dr. Thomes Dinning notified. Will continue to assess.

## 2020-04-17 NOTE — Progress Notes (Signed)
Palliative: Angelica Wong is viewed through the glass door of the ICU room.  She remains Covid positive.  She is alert and oriented, able to make her needs known.  She is knowledgeable about her oxygen requirements, the plan of care.  Angelica Wong goals are set for full scope/full code.  She tells me that she would accept a trach/PEG if needed.  Long-term care placement if needed.  Conference with attending, bedside nursing staff, transition of care team related to patient condition, needs, goals of care. PMT to shadow for declines.  Plan:  Continue to treat the treatable. Would accept trach/PEG if needed to preserve life.   No charge Lillia Carmel, NP Palliative Medicine Team Team Phone # [phone] Greater than 50% of this time was spent counseling and coordinating care related to the above assessment and plan.

## 2020-04-17 NOTE — Progress Notes (Addendum)
PROGRESS NOTE  Angelica Wong LGX:[id number] DOB: [birth year] DOA: 04/12/2020 PCP: Lenoria Chime, FNP  Brief History:  69 y.o.femalewith medical history significant ofhypertension, hypothyroidism, morbid obesity, was recently diagnosed with COVID-19 on 11/3 /21.  She had a positive test at a local drugstore. She was seen on 04/07/20 in the emergency room and at that time she was not hypoxic. She received a monoclonal antibody infusion the following day on 11/9. She reports over the past 3 days she has had worsening shortness of breath. She is also had frequent diarrhea, abdominal cramping and nausea. She has not had any vomiting. She continues to have fevers. Her husband also has COVID-19, but overall is doing better than she is. She is unvaccinated. The patient was admitted and started on IV solumedrol and remdesivir.  Assessment/Plan: Acute respiratory failure with hypoxia secondary COVID-19 pneumonia -Patient was requiring oxygen up to 15 L HFNC>>12L today -Finished 5 days remdesivir 11/18 -d/c baricitinib if leukopenia/thrombocytopenia worsen -Change steroids back to IV -Ferritin 1272>> 729>> 588>> 486>> 460>>440 -CRP 5.9>> 7.4>> 4.5>> 2.5>> 1.8>>5.2 -D-dimer 1.71>> 1.87>> 1.41>> 1.65>> 2.96>>3.93 -CTA chest--Diffuse ground-glass airspace disease in the upper lobes and peripheral lower lobes. No pneumothorax. No pleural fluid -PCT 0.48 -Finish 5 days levofloxacin which was started secondary to elevated PCT -OOB each shift -incentive spirometry -prone positioning as much as possible  Thrombocytopenia -Appears to be chronic, worsened by acute infection -Patient has been evaluated at hematology clinic in [place]  -Check B12--1650 -Check folic acid--11.8 -may have to d/c baricitinib if continues to worsen  Hypothyroidism -Continue Synthroid  Hyperglycemia -Due to steroids -Continue NovoLog sliding scale -Continue reduced dose Lantus -04/13/2020  hemoglobin A1c 5.6  Transaminasemia -Secondary to COVID-19 infection -Overall trending down/stable  Essential hypertension -Continue metoprolol succinate  Depression/anxiety -Continue Cymbalta  Morbid Obesity -BMI 44.90 -lifestyle modification    Status is: Inpatient  Remains inpatient appropriate because:Hemodynamically unstable and IV treatments appropriate due to intensity of illness or inability to take PO   Dispo: The patient is from: Home  Anticipated d/c is to: Home  Anticipated d/c date is: 3 days  Patient currently is not medically stable to d/c.        Family Communication:   No Family at bedside  Consultants:  palliative  Code Status:  FULL   DVT Prophylaxis:  SCDs   Procedures: As Listed in Progress Note Above  Antibiotics: None   Subjective: Patient is breathing better, but remains sob with exertion.  Denies f/c, cp, n/v/d, abd pain  Objective: Vitals:   04/17/20 0700 04/17/20 0800 04/17/20 0900 04/17/20 1000  BP: (!) 162/79 (!) 129/91 128/68 (!) 136/53  Pulse: (!) 57 70 70 66  Resp: 18 (!) 24 (!) 22 (!) 22  Temp:  98.5 F (36.9 C)    TempSrc:  Oral    SpO2: 95% (!) 88% 91% 94%  Weight:      Height:        Intake/Output Summary (Last 24 hours) at 04/17/2020 1402 Last data filed at 04/17/2020 0530 Gross per 24 hour  Intake 342.12 ml  Output 1725 ml  Net -1382.88 ml   Weight change:  Exam:   General:  Pt is alert, follows commands appropriately, not in acute distress  HEENT: No icterus, No thrush, No neck mass, [place]/AT  Cardiovascular: RRR, S1/S2, no rubs, no gallops  Respiratory: bilateral crackles. No wheeze  Abdomen: Soft/+BS, non tender, non distended, no guarding  Extremities: Nonpitting  edema, No lymphangitis, No petechiae, No rashes, no synovitis   Data Reviewed: I have personally reviewed following labs and imaging studies Basic Metabolic  Panel: Recent Labs  Lab 04/13/20 0456 04/14/20 0556 04/15/20 0520 04/16/20 0517 04/17/20 0735  NA 137 135 137 139 135  K 4.9 4.5 4.1 3.7 4.1  CL 104 104 107 108 107  CO2 23 24 23 24 23   GLUCOSE 158* 130* 142* 84 146*  BUN 20 29* 29* 28* 23  CREATININE 0.93 1.05* 1.13* 1.09* 0.86  CALCIUM 8.3* 8.0* 7.9* 8.2* 7.8*   Liver Function Tests: Recent Labs  Lab 04/13/20 0456 04/14/20 0556 04/15/20 0520 04/16/20 0517 04/17/20 0735  AST 194* 109* 93* 115* 96*  ALT 92* 63* 57* 71* 70*  ALKPHOS 389* 289* 281* 308* 259*  BILITOT 1.7* 1.2 1.2 1.7* 1.7*  PROT 7.0 5.5* 5.3* 6.2* 5.5*  ALBUMIN 3.0* 2.5* 2.4* 2.7* 2.4*   No results for input(s): LIPASE, AMYLASE in the last 168 hours. No results for input(s): AMMONIA in the last 168 hours. Coagulation Profile: Recent Labs  Lab 04/12/20 0640  INR 0.9   CBC: Recent Labs  Lab 04/13/20 0456 04/14/20 0556 04/15/20 0520 04/16/20 0517 04/17/20 0735  WBC 1.7* 2.9* 2.9* 4.1 1.8*  NEUTROABS 1.3* 2.2 2.4 3.2 1.4*  HGB 14.0 12.0 11.7* 13.1 12.1  HCT 42.0 36.8 34.8* 40.3 36.6  MCV 88.1 88.0 88.1 89.4 87.4  PLT 59* 55* 51* 49* 43*   Cardiac Enzymes: No results for input(s): CKTOTAL, CKMB, CKMBINDEX, TROPONINI in the last 168 hours. BNP: Invalid input(s): POCBNP CBG: Recent Labs  Lab 04/16/20 1716 04/16/20 2105 04/17/20 0022 04/17/20 0831 04/17/20 1258  GLUCAP 147* 140* 141* 124* 141*   HbA1C: No results for input(s): HGBA1C in the last 72 hours. Urine analysis:    Component Value Date/Time   COLORURINE YELLOW 08/05/2011 1107   APPEARANCEUR CLEAR 08/05/2011 1107   LABSPEC 1.025 08/05/2011 1107   PHURINE 6.0 08/05/2011 1107   GLUCOSEU NEGATIVE 08/05/2011 1107   HGBUR NEGATIVE 08/05/2011 1107   BILIRUBINUR NEGATIVE 08/05/2011 1107   KETONESUR NEGATIVE 08/05/2011 1107   PROTEINUR NEGATIVE 08/05/2011 1107   UROBILINOGEN 0.2 08/05/2011 1107   NITRITE NEGATIVE 08/05/2011 1107   LEUKOCYTESUR NEGATIVE 08/05/2011 1107    Sepsis Labs: @LABRCNTIP (procalcitonin:4,lacticidven:4) ) Recent Results (from the past 240 hour(s))  MRSA PCR Screening     Status: None   Collection Time: 04/13/20 11:50 PM   Specimen: Nasopharyngeal  Result Value Ref Range Status   MRSA by PCR NEGATIVE NEGATIVE Final    Comment:        The GeneXpert MRSA Assay (FDA approved for NASAL specimens only), is one component of a comprehensive MRSA colonization surveillance program. It is not intended to diagnose MRSA infection nor to guide or monitor treatment for MRSA infections. Performed at [facility], [address], [place], [address] [place]      Scheduled Meds: . albuterol  2 puff Inhalation Q6H  . baricitinib  4 mg Oral Daily  . Chlorhexidine Gluconate Cloth  6 each Topical Daily  . DULoxetine  60 mg Oral Daily  . insulin aspart  0-20 Units Subcutaneous TID WC  . insulin aspart  0-5 Units Subcutaneous QHS  . insulin aspart  2 Units Subcutaneous TID WC  . levothyroxine  112 mcg Oral Q0600  . methylPREDNISolone (SOLU-MEDROL) injection  125 mg Intravenous Q12H  . metoprolol succinate  25 mg Oral Daily  . montelukast  10 mg Oral QHS  Continuous Infusions:  Procedures/Studies: CT ANGIO CHEST PE W OR WO CONTRAST  Result Date: 04/16/2020 CLINICAL DATA:  Concern for pulmonary embolism. Positive D-dimer. Positive COVID-19. EXAM: CT ANGIOGRAPHY CHEST WITH CONTRAST TECHNIQUE: Multidetector CT imaging of the chest was performed using the standard protocol during bolus administration of intravenous contrast. Multiplanar CT image reconstructions and MIPs were obtained to evaluate the vascular anatomy. CONTRAST:  OMNIPAQUE IOHEXOL 350 MG/ML SOLN COMPARISON:  None FINDINGS: Cardiovascular: No filling defects within the pulmonary arteries to suggest acute pulmonary embolism. No significant vascular findings. Normal heart size. No pericardial effusion. Mediastinum/Nodes: No axillary or supraclavicular adenopathy. No mediastinal  or hilar adenopathy. No pericardial fluid. Esophagus normal. Lungs/Pleura: Diffuse ground-glass airspace disease in the upper lobes and peripheral lower lobes. No pneumothorax. No pleural fluid Upper Abdomen: Limited view of the liver, kidneys, pancreas are unremarkable. Normal adrenal glands. Musculoskeletal: No aggressive osseous lesion Review of the MIP images confirms the above findings. IMPRESSION: 1. No evidence acute pulmonary embolism. 2. Diffuse bilateral ground-glass airspace disease consistent with COVID pneumonia. Electronically Signed   By: Genevive Bi M.D.   On: 04/16/2020 17:02   DG Chest Port 1 View  Result Date: 04/12/2020 CLINICAL DATA:  COVID positive. EXAM: PORTABLE CHEST 1 VIEW COMPARISON:  04/07/2020 FINDINGS: 0649 hours. Lungs are hyperexpanded. Interval progression of patchy peripheral ground-glass airspace opacity compatible with multifocal pneumonia. The cardio pericardial silhouette is enlarged. The visualized bony structures of the thorax show no acute abnormality. Telemetry leads overlie the chest. IMPRESSION: Progression of peripherally predominant patchy ground-glass attenuation consistent with multifocal pneumonia. Electronically Signed   By: Kennith Center M.D.   On: 04/12/2020 07:26   DG Chest Portable 1 View  Result Date: 04/07/2020 CLINICAL DATA:  Cough shortness of breath COVID positive EXAM: PORTABLE CHEST 1 VIEW COMPARISON:  None. FINDINGS: The heart size and mediastinal contours are within normal limits. Hazy patchy airspace opacity seen within the right mid lung and bilateral lung bases. Aortic calcifications are seen. The visualized skeletal structures are unremarkable. IMPRESSION: Patchy airspace opacity seen within both lungs which be due to infectious etiology. Electronically Signed   By: Jonna Clark M.D.   On: 04/07/2020 18:46   US Abdomen Limited RUQ (LIVER/GB)  Result Date: 04/12/2020 CLINICAL DATA:  Elevated LFTs. EXAM: ULTRASOUND ABDOMEN LIMITED  RIGHT UPPER QUADRANT COMPARISON:  None. FINDINGS: Gallbladder: No gallstones or gallbladder wall thickening. No pericholecystic fluid. The sonographer reports no sonographic Murphy's sign. Common bile duct: Diameter: 11 mm Liver: Liver parenchyma is echogenic and heterogeneous. Portal vein is patent on color Doppler imaging with normal direction of blood flow towards the liver. Other: None. IMPRESSION: Extrahepatic biliary duct dilatation. MR I/MRCP could be used to further evaluate as clinically warranted. Heterogeneous echogenic liver, potentially related to steatosis. Electronically Signed   By: Kennith Center M.D.   On: 04/12/2020 11:27    Catarina Hartshorn, DO  [facility]  If 7PM-7AM, please contact night-coverage [email or web address] Password [email or web address] 04/17/2020, 2:02 PM   LOS: 5 days

## 2020-04-18 DIAGNOSIS — R7989 Other specified abnormal findings of blood chemistry: Secondary | ICD-10-CM | POA: Diagnosis not present

## 2020-04-18 DIAGNOSIS — U071 COVID-19: Secondary | ICD-10-CM | POA: Diagnosis not present

## 2020-04-18 DIAGNOSIS — J9601 Acute respiratory failure with hypoxia: Secondary | ICD-10-CM | POA: Diagnosis not present

## 2020-04-18 DIAGNOSIS — I10 Essential (primary) hypertension: Secondary | ICD-10-CM | POA: Diagnosis not present

## 2020-04-18 LAB — COMPREHENSIVE METABOLIC PANEL
ALT: 69 U/L — ABNORMAL HIGH (ref 0–44)
AST: 86 U/L — ABNORMAL HIGH (ref 15–41)
Albumin: 2.5 g/dL — ABNORMAL LOW (ref 3.5–5.0)
Alkaline Phosphatase: 269 U/L — ABNORMAL HIGH (ref 38–126)
Anion gap: 8 (ref 5–15)
BUN: 24 mg/dL — ABNORMAL HIGH (ref 8–23)
CO2: 24 mmol/L (ref 22–32)
Calcium: 8.2 mg/dL — ABNORMAL LOW (ref 8.9–10.3)
Chloride: 103 mmol/L (ref 98–111)
Creatinine, Ser: 0.9 mg/dL (ref 0.44–1.00)
GFR, Estimated: >60 mL/min (ref >60)
Glucose, Bld: 148 mg/dL — ABNORMAL HIGH (ref 70–99)
Potassium: 4.3 mmol/L (ref 3.5–5.1)
Sodium: 135 mmol/L (ref 135–145)
Total Bilirubin: 2 mg/dL — ABNORMAL HIGH (ref 0.3–1.2)
Total Protein: 5.7 g/dL — ABNORMAL LOW (ref 6.5–8.1)

## 2020-04-18 LAB — CBC
HCT: 39.5 % (ref 36.0–46.0)
Hemoglobin: 13.1 g/dL (ref 12.0–15.0)
MCH: 28.5 pg (ref 26.0–34.0)
MCHC: 33.2 g/dL (ref 30.0–36.0)
MCV: 85.9 fL (ref 80.0–100.0)
Platelets: 47 10*3/uL — ABNORMAL LOW (ref 150–400)
RBC: 4.6 MIL/uL (ref 3.87–5.11)
RDW: 14.8 % (ref 11.5–15.5)
WBC: 2.6 10*3/uL — ABNORMAL LOW (ref 4.0–10.5)
nRBC: 0 % (ref 0.0–0.2)

## 2020-04-18 LAB — C-REACTIVE PROTEIN: CRP: 3.4 mg/dL — ABNORMAL HIGH (ref <1.0)

## 2020-04-18 LAB — GLUCOSE, CAPILLARY
Glucose-Capillary: 116 mg/dL — ABNORMAL HIGH (ref 70–99)
Glucose-Capillary: 205 mg/dL — ABNORMAL HIGH (ref 70–99)
Glucose-Capillary: 138 mg/dL — ABNORMAL HIGH (ref 70–99)
Glucose-Capillary: 125 mg/dL — ABNORMAL HIGH (ref 70–99)

## 2020-04-18 LAB — FERRITIN: Ferritin: 468 ng/mL — ABNORMAL HIGH (ref 11–307)

## 2020-04-18 LAB — MAGNESIUM: Magnesium: 2 mg/dL (ref 1.7–2.4)

## 2020-04-18 LAB — D-DIMER, QUANTITATIVE: D-Dimer, Quant: 3.96 ug/mL-FEU — ABNORMAL HIGH (ref 0.00–0.50)

## 2020-04-18 MED ORDER — PROSOURCE PLUS PO LIQD
30.0000 mL | Freq: Two times a day (BID) | ORAL | Status: DC
  Administered 2020-04-18 – 2020-04-22 (×9): 30 mL via ORAL
  Filled 2020-04-18 (×7): qty 30

## 2020-04-18 MED ORDER — ENSURE ENLIVE PO LIQD
237.0000 mL | Freq: Two times a day (BID) | ORAL | Status: DC
  Administered 2020-04-18 – 2020-04-24 (×11): 237 mL via ORAL

## 2020-04-18 NOTE — Progress Notes (Signed)
PROGRESS NOTE  Angelica Wong SHF:[id number] DOB: 1951/05/15 DOA: 04/12/2020 PCP: Lenoria Chime, FNP  Brief History: 69 y.o.femalewith medical history significant ofhypertension, hypothyroidism, morbid obesity, was recently diagnosed with COVID-19on 11/3 /21. She had a positive test at a local drugstore. She was seen on11/8/21in the emergency room and at that time she was not hypoxic. She received a monoclonal antibody infusion the following day on 11/9. She reports over the past 3 days she has had worsening shortness of breath. She is also had frequent diarrhea, abdominal cramping and nausea. She has not had any vomiting. She continues to have fevers. Her husband also has COVID-19, but overall is doing better than she is. She is unvaccinated.The patient was admitted and started on IV solumedrol and remdesivir.  Assessment/Plan: Acute respiratory failure with hypoxia secondary COVID-19 pneumonia -Patient was requiring oxygen up to 15 LHFNC>>12L today -Finished 5 days remdesivir 11/17 -d/c baricitinib if leukopenia/thrombocytopenia worsen -Change steroids back to IV -Ferritin 1272>>729>>588>>486>>460>>440>>468 -CRP 5.9>>7.4>>4.5>>2.5>>1.8>>5.2>>3.4 -D-dimer 1.71>>1.87>>1.41>>1.65>>2.96>>3.93>>3.96 -CTA chest--Diffuse ground-glass airspace disease in the upper lobes and peripheral lower lobes. No pneumothorax. No pleural fluid -PCT 0.48 -Finished5 days levofloxacin (11/18) which was started secondary to elevated PCT -OOB each shift -incentive spirometry -prone positioning as much as possible  Thrombocytopenia -Appears to be chronic, worsened by acute infection -Patient has been evaluated at hematology clinic in [place] -Check B12--1650 -Check folic acid--11.8 -may have to d/c baricitinib if continues to worsen  Hypothyroidism -Continue Synthroid  Hyperglycemia -Due to steroids -Continue NovoLog sliding scale -Continue reduced  dose Lantus -04/13/2020 hemoglobin A1c 5.6 -CBGs controlled  Transaminasemia -Secondary to COVID-19 infection -Overall trending down/stable  Essential hypertension -Continue metoprolol succinate  Depression/anxiety -Continue Cymbalta  Morbid Obesity -BMI 44.90 -lifestyle modification    Status is: Inpatient  Remains inpatient appropriate because:Hemodynamically unstable and IV treatments appropriate due to intensity of illness or inability to take PO   Dispo: The patient is from:Home Anticipated d/c is FO:YDXA Anticipated d/c date is: 3 days Patient currently is not medically stable to d/c.        Family Communication:NoFamily at bedside  Consultants:palliative  Code Status: FULL   DVT Prophylaxis: SCDs   Procedures: As Listed in Progress Note Above  Antibiotics: None     Subjective: Patient breathing a little better than yesterday.  Denies cp, n/v/d, f/c, cough, hemoptysis, abd pain  Objective: Vitals:   04/18/20 0400 04/18/20 0500 04/18/20 0600 04/18/20 1057  BP: (!) 146/70 138/64 (!) 145/72   Pulse: (!) 59 62 (!) 58   Resp: 17 18 16    Temp:      TempSrc:      SpO2: 93% 94% 98% 93%  Weight:      Height:        Intake/Output Summary (Last 24 hours) at 04/18/2020 1131 Last data filed at 04/18/2020 0500 Gross per 24 hour  Intake 120 ml  Output 1100 ml  Net -980 ml   Weight change:  Exam:   General:  Pt is alert, follows commands appropriately, not in acute distress  HEENT: No icterus, No thrush, No neck mass, [place]/AT  Cardiovascular: RRR, S1/S2, no rubs, no gallops  Respiratory: bibasilar crackles. No wheeze  Abdomen: Soft/+BS, non tender, non distended, no guarding  Extremities: Nonpitting edema, No lymphangitis, No petechiae, No rashes, no synovitis   Data Reviewed: I have personally reviewed following labs and imaging studies Basic Metabolic  Panel: Recent Labs  Lab 04/14/20 0556 04/15/20 0520 04/16/20 0517  04/17/20 0735 04/18/20 0441  NA 135 137 139 135 135  K 4.5 4.1 3.7 4.1 4.3  CL 104 107 108 107 103  CO2 24 23 24 23 24   GLUCOSE 130* 142* 84 146* 148*  BUN 29* 29* 28* 23 24*  CREATININE 1.05* 1.13* 1.09* 0.86 0.90  CALCIUM 8.0* 7.9* 8.2* 7.8* 8.2*  MG  --   --   --   --  2.0   Liver Function Tests: Recent Labs  Lab 04/14/20 0556 04/15/20 0520 04/16/20 0517 04/17/20 0735 04/18/20 0441  AST 109* 93* 115* 96* 86*  ALT 63* 57* 71* 70* 69*  ALKPHOS 289* 281* 308* 259* 269*  BILITOT 1.2 1.2 1.7* 1.7* 2.0*  PROT 5.5* 5.3* 6.2* 5.5* 5.7*  ALBUMIN 2.5* 2.4* 2.7* 2.4* 2.5*   No results for input(s): LIPASE, AMYLASE in the last 168 hours. No results for input(s): AMMONIA in the last 168 hours. Coagulation Profile: Recent Labs  Lab 04/12/20 0640  INR 0.9   CBC: Recent Labs  Lab 04/13/20 0456 04/13/20 0456 04/14/20 0556 04/15/20 0520 04/16/20 0517 04/17/20 0735 04/18/20 0441  WBC 1.7*   < > 2.9* 2.9* 4.1 1.8* 2.6*  NEUTROABS 1.3*  --  2.2 2.4 3.2 1.4*  --   HGB 14.0   < > 12.0 11.7* 13.1 12.1 13.1  HCT 42.0   < > 36.8 34.8* 40.3 36.6 39.5  MCV 88.1   < > 88.0 88.1 89.4 87.4 85.9  PLT 59*   < > 55* 51* 49* 43* 47*   < > = values in this interval not displayed.   Cardiac Enzymes: No results for input(s): CKTOTAL, CKMB, CKMBINDEX, TROPONINI in the last 168 hours. BNP: Invalid input(s): POCBNP CBG: Recent Labs  Lab 04/17/20 0831 04/17/20 1258 04/17/20 1724 04/17/20 2019 04/18/20 0852  GLUCAP 124* 141* 118* 148* 116*   HbA1C: No results for input(s): HGBA1C in the last 72 hours. Urine analysis:    Component Value Date/Time   COLORURINE YELLOW 08/05/2011 1107   APPEARANCEUR CLEAR 08/05/2011 1107   LABSPEC 1.025 08/05/2011 1107   PHURINE 6.0 08/05/2011 1107   GLUCOSEU NEGATIVE 08/05/2011 1107   HGBUR NEGATIVE 08/05/2011 1107   BILIRUBINUR NEGATIVE 08/05/2011 1107   KETONESUR NEGATIVE  08/05/2011 1107   PROTEINUR NEGATIVE 08/05/2011 1107   UROBILINOGEN 0.2 08/05/2011 1107   NITRITE NEGATIVE 08/05/2011 1107   LEUKOCYTESUR NEGATIVE 08/05/2011 1107   Sepsis Labs: @LABRCNTIP (procalcitonin:4,lacticidven:4) ) Recent Results (from the past 240 hour(s))  MRSA PCR Screening     Status: None   Collection Time: 04/13/20 11:50 PM   Specimen: Nasopharyngeal  Result Value Ref Range Status   MRSA by PCR NEGATIVE NEGATIVE Final    Comment:        The GeneXpert MRSA Assay (FDA approved for NASAL specimens only), is one component of a comprehensive MRSA colonization surveillance program. It is not intended to diagnose MRSA infection nor to guide or monitor treatment for MRSA infections. Performed at [facility], [address] [place], [address] [place]      Scheduled Meds: . albuterol  2 puff Inhalation Q6H  . baricitinib  4 mg Oral Daily  . Chlorhexidine Gluconate Cloth  6 each Topical Daily  . DULoxetine  60 mg Oral Daily  . insulin aspart  0-20 Units Subcutaneous TID WC  . insulin aspart  0-5 Units Subcutaneous QHS  . insulin aspart  2 Units Subcutaneous TID WC  . levothyroxine  112 mcg Oral Q0600  . methylPREDNISolone (SOLU-MEDROL)  injection  125 mg Intravenous Q12H  . metoprolol succinate  25 mg Oral Daily  . montelukast  10 mg Oral QHS   Continuous Infusions:  Procedures/Studies: CT ANGIO CHEST PE W OR WO CONTRAST  Result Date: 04/16/2020 CLINICAL DATA:  Concern for pulmonary embolism. Positive D-dimer. Positive COVID-19. EXAM: CT ANGIOGRAPHY CHEST WITH CONTRAST TECHNIQUE: Multidetector CT imaging of the chest was performed using the standard protocol during bolus administration of intravenous contrast. Multiplanar CT image reconstructions and MIPs were obtained to evaluate the vascular anatomy. CONTRAST:  OMNIPAQUE IOHEXOL 350 MG/ML SOLN COMPARISON:  None FINDINGS: Cardiovascular: No filling defects within the pulmonary arteries to suggest acute  pulmonary embolism. No significant vascular findings. Normal heart size. No pericardial effusion. Mediastinum/Nodes: No axillary or supraclavicular adenopathy. No mediastinal or hilar adenopathy. No pericardial fluid. Esophagus normal. Lungs/Pleura: Diffuse ground-glass airspace disease in the upper lobes and peripheral lower lobes. No pneumothorax. No pleural fluid Upper Abdomen: Limited view of the liver, kidneys, pancreas are unremarkable. Normal adrenal glands. Musculoskeletal: No aggressive osseous lesion Review of the MIP images confirms the above findings. IMPRESSION: 1. No evidence acute pulmonary embolism. 2. Diffuse bilateral ground-glass airspace disease consistent with COVID pneumonia. Electronically Signed   By: Genevive Bi M.D.   On: 04/16/2020 17:02   DG Chest Port 1 View  Result Date: 04/12/2020 CLINICAL DATA:  COVID positive. EXAM: PORTABLE CHEST 1 VIEW COMPARISON:  04/07/2020 FINDINGS: 0649 hours. Lungs are hyperexpanded. Interval progression of patchy peripheral ground-glass airspace opacity compatible with multifocal pneumonia. The cardio pericardial silhouette is enlarged. The visualized bony structures of the thorax show no acute abnormality. Telemetry leads overlie the chest. IMPRESSION: Progression of peripherally predominant patchy ground-glass attenuation consistent with multifocal pneumonia. Electronically Signed   By: Kennith Center M.D.   On: 04/12/2020 07:26   DG Chest Portable 1 View  Result Date: 04/07/2020 CLINICAL DATA:  Cough shortness of breath COVID positive EXAM: PORTABLE CHEST 1 VIEW COMPARISON:  None. FINDINGS: The heart size and mediastinal contours are within normal limits. Hazy patchy airspace opacity seen within the right mid lung and bilateral lung bases. Aortic calcifications are seen. The visualized skeletal structures are unremarkable. IMPRESSION: Patchy airspace opacity seen within both lungs which be due to infectious etiology. Electronically Signed    By: Jonna Clark M.D.   On: 04/07/2020 18:46   US Abdomen Limited RUQ (LIVER/GB)  Result Date: 04/12/2020 CLINICAL DATA:  Elevated LFTs. EXAM: ULTRASOUND ABDOMEN LIMITED RIGHT UPPER QUADRANT COMPARISON:  None. FINDINGS: Gallbladder: No gallstones or gallbladder wall thickening. No pericholecystic fluid. The sonographer reports no sonographic Murphy's sign. Common bile duct: Diameter: 11 mm Liver: Liver parenchyma is echogenic and heterogeneous. Portal vein is patent on color Doppler imaging with normal direction of blood flow towards the liver. Other: None. IMPRESSION: Extrahepatic biliary duct dilatation. MR I/MRCP could be used to further evaluate as clinically warranted. Heterogeneous echogenic liver, potentially related to steatosis. Electronically Signed   By: Kennith Center M.D.   On: 04/12/2020 11:27    Catarina Hartshorn, DO  [facility]  If 7PM-7AM, please contact night-coverage [email or web address] Password [email or web address] 04/18/2020, 11:31 AM   LOS: 6 days

## 2020-04-18 NOTE — Progress Notes (Signed)
Initial Nutrition Assessment  DOCUMENTATION CODES:   Morbid obesity  INTERVENTION:  Ensure Enlive po BID, each supplement provides 350 kcal and 20 grams of protein  Prosource Plus 30 ml po BID, each supplement provides 100 kcal and 15 grams of protein   NUTRITION DIAGNOSIS:   Increased nutrient needs related to catabolic illness (pneumonia secondary to COVID-19 virus infection) as evidenced by estimated needs.  GOAL:   Patient will meet greater than or equal to 90% of their needs   MONITOR:   PO intake, Weight trends, Supplement acceptance, I & O's, Labs  REASON FOR ASSESSMENT:   LOS    ASSESSMENT:  RD working remotely.  69 year old female with history significant of HTN, hypothyroidism, anxiety, and morbid obesity who was recently seen in ED on 11/8 after 5 days of COVID-19 symptoms with positive test at a local drug store. Patient was able to ambulate, maintaining 98% and above on room air without significant shortness of breath and was discharged with plans to receive outpatient monoclonal antibody infusion. Patient received infusion on 11/9 and returned to ED on 11/13 with 3 days of worsening shortness of breath, ongoing fevers, frequent diarrhea, abdominal cramping and nausea.  Patient admitted with acute respiratory failure with hypoxia secondary to COVID-19 pneumonia.   She is currently requiring 12 L HFNC, improved from initial 15 L and completed 5 days of Remdesivir on 11/17.   Meal intake has improved, on 11/16 pt eating 25% of all meals and consumed 100% of breakfast tray yesterday. No other documented intakes for review. Suspect poor nutrition since onset of symptoms ~ 16 days ago given report of frequent diarrhea, nausea and worsening shortness of breath. Patient has increased calorie/protien needs secondary to catabolic nature of COVID-19 virus infection and is at high risk for malnutrition. She is currently ordered a Heart Healthy diet which restricts protein.  Will order Ensure as well as ProSource Plus supplements BID to aid with meeting needs.   Weights up +3.5 lbs this admission; Net +1.2 L. No recent history for review, last wt prior to admission 113.4 kg (249.48 lbs) on 06/21/17. Given morbid obesity, will use adjusted IBW (83.72 kg) for estimating needs.   I/Os: +1227.4 ml since admit UOP: 1100 ml x 24 hrs  Medications reviewed and include: SSI, Methylprednisolone  Labs: CBGs [phone] A1c 5.6 -04/13/20  NUTRITION - FOCUSED PHYSICAL EXAM:  Unable to complete at this time, RD working remotely.  Diet Order:   Diet Order            Diet Heart Room service appropriate? Yes; Fluid consistency: Thin  Diet effective now                 EDUCATION NEEDS:   Not appropriate for education at this time  Skin:  Skin Assessment: Reviewed RN Assessment  Last BM:  11/14  Height:   Ht Readings from Last 1 Encounters:  04/14/20 5\' 5"  (1.651 m)    Weight:   Wt Readings from Last 1 Encounters:  04/17/20 124.1 kg    Ideal Body Weight:  56.8 kg  BMI:  Body mass index is 45.53 kg/m.  Estimated Nutritional Needs:   Kcal:  04/19/20  Protein:  126-142  Fluid:  >/= 2.3 L/day   [id number], RD, LDN Clinical Nutrition After Hours/Weekend Pager # in Amion

## 2020-04-19 ENCOUNTER — Inpatient Hospital Stay (HOSPITAL_COMMUNITY): Payer: Medicare Other

## 2020-04-19 ENCOUNTER — Inpatient Hospital Stay: Payer: Self-pay

## 2020-04-19 DIAGNOSIS — R7989 Other specified abnormal findings of blood chemistry: Secondary | ICD-10-CM | POA: Diagnosis not present

## 2020-04-19 DIAGNOSIS — I10 Essential (primary) hypertension: Secondary | ICD-10-CM | POA: Diagnosis not present

## 2020-04-19 DIAGNOSIS — J9601 Acute respiratory failure with hypoxia: Secondary | ICD-10-CM | POA: Diagnosis not present

## 2020-04-19 LAB — GLUCOSE, CAPILLARY
Glucose-Capillary: 132 mg/dL — ABNORMAL HIGH (ref 70–99)
Glucose-Capillary: 138 mg/dL — ABNORMAL HIGH (ref 70–99)
Glucose-Capillary: 143 mg/dL — ABNORMAL HIGH (ref 70–99)

## 2020-04-19 MED ORDER — SODIUM CHLORIDE 0.9% FLUSH
10.0000 mL | Freq: Two times a day (BID) | INTRAVENOUS | Status: DC
  Administered 2020-04-19 – 2020-04-23 (×9): 10 mL
  Administered 2020-04-24: 20 mL

## 2020-04-19 MED ORDER — SODIUM CHLORIDE 0.9% FLUSH
10.0000 mL | INTRAVENOUS | Status: DC | PRN
  Administered 2020-04-19: 10 mL

## 2020-04-19 MED ORDER — ALBUTEROL SULFATE HFA 108 (90 BASE) MCG/ACT IN AERS
2.0000 | INHALATION_SPRAY | Freq: Three times a day (TID) | RESPIRATORY_TRACT | Status: DC
  Administered 2020-04-19 – 2020-04-24 (×17): 2 via RESPIRATORY_TRACT

## 2020-04-19 NOTE — Progress Notes (Signed)
Peripherally Inserted Central Catheter Placement  The IV Nurse has discussed with the patient and/or persons authorized to consent for the patient, the purpose of this procedure and the potential benefits and risks involved with this procedure.  The benefits include less needle sticks, lab draws from the catheter, and the patient may be discharged home with the catheter. Risks include, but not limited to, infection, bleeding, blood clot (thrombus formation), and puncture of an artery; nerve damage and irregular heartbeat and possibility to perform a PICC exchange if needed/ordered by physician.  Alternatives to this procedure were also discussed.  Bard Power PICC patient education guide, fact sheet on infection prevention and patient information card has been provided to patient /or left at bedside.    PICC Placement Documentation  PICC Single Lumen 04/19/20 PICC Right Cephalic 45 cm 0 cm (Active)  Indication for Insertion or Continuance of Line Limited venous access - need for IV therapy >5 days (PICC only) 04/19/20 1600  Exposed Catheter (cm) 0 cm 04/19/20 1600  Site Assessment Clean;Dry;Intact 04/19/20 1600  Line Status Blood return noted;Flushed;Saline locked 04/19/20 1600  Dressing Type Transparent 04/19/20 1600  Dressing Status Clean;Dry;Intact 04/19/20 1600  Antimicrobial disc in place? Yes 04/19/20 1600  Safety Lock Not Applicable 04/19/20 1600  Line Care Connections checked and tightened 04/19/20 1600  Dressing Change Due 04/26/20 04/19/20 1600       [place] M 04/19/2020, 4:26 PM

## 2020-04-19 NOTE — Progress Notes (Signed)
Primary RN notified that PICC line is good for use.

## 2020-04-19 NOTE — Progress Notes (Signed)
PROGRESS NOTE  Angelica Wong ONG:[id number] DOB: 1950/09/29 DOA: 04/12/2020 PCP: Lenoria Chime, FNP  Brief History: 69 y.o.femalewith medical history significant ofhypertension, hypothyroidism, morbid obesity, was recently diagnosed with COVID-19on 11/3 /21. She had a positive test at a local drugstore. She was seen on11/8/21in the emergency room and at that time she was not hypoxic. She received a monoclonal antibody infusion the following day on 11/9. She reports over the past 3 days she has had worsening shortness of breath. She is also had frequent diarrhea, abdominal cramping and nausea. She has not had any vomiting. She continues to have fevers. Her husband also has COVID-19, but overall is doing better than she is. She is unvaccinated.The patient was admitted and started on IV solumedrol and remdesivir.  Assessment/Plan: Acute respiratory failure with hypoxia secondary COVID-19 pneumonia -Patient was requiring oxygen up to 15LHFNC>>12L>>10L -Finished 5 daysremdesivir 11/17 -d/c baricitinib if leukopenia/thrombocytopenia worsen -Changed steroids back to IV -Ferritin 1272>>729>>588>>486>>460>>440>>468 -CRP 5.9>>7.4>>4.5>>2.5>>1.8>>5.2>>3.4 -D-dimer 1.71>>1.87>>1.41>>1.65>>2.96>>3.93>>3.96 -labs for 11/20 AM still pending -CTA chest--Diffuse ground-glass airspace disease in the upper lobes and peripheral lower lobes. No pneumothorax. No pleural fluid -PCT 0.48 -Finished5 days levofloxacin (11/18) which was started secondary to elevated PCT -OOB each shift -incentive spirometry -prone positioning as much as possible  Thrombocytopenia -Appears to be chronic, worsened by acute infection -Patient has been evaluated at hematology clinic in [place] -Check B12--1650 -Check folic acid--11.8 -may have to d/c baricitinib if continues to worsen  Hypothyroidism -Continue Synthroid  Hyperglycemia -Due to steroids -Continue NovoLog  sliding scale -Continue reduced dose Lantus -04/13/2020 hemoglobin A1c 5.6 -CBGs controlled  Transaminasemia -Secondary to COVID-19 infection -Overall trending down/stable  Essential hypertension -Continue metoprolol succinate  Depression/anxiety -Continue Cymbalta  Morbid Obesity -BMI 44.90 -lifestyle modification    Status is: Inpatient  Remains inpatient appropriate because:Hemodynamically unstable and IV treatments appropriate due to intensity of illness or inability to take PO   Dispo: The patient is from:Home Anticipated d/c is GM:[facility] Anticipated d/c date is: 3 days Patient currently is not medically stable to d/c.        Family Communication:NoFamily at bedside  Consultants:palliative  Code Status: FULL   DVT Prophylaxis: SCDs   Procedures: As Listed in Progress Note Above  Antibiotics: None     Subjective: Patient states she is continuing to breathe a little better each day.  Still has some sob with exertion.  Denies f/c, cp, n/v/d, abd pain  Objective: Vitals:   04/19/20 0000 04/19/20 0200 04/19/20 0400 04/19/20 0600  BP: (!) 120/51 121/62 (!) 128/51 (!) 153/78  Pulse: 71 69 70 66  Resp: 19 17 14 17   Temp:      TempSrc:      SpO2: 94% 99% 95% 99%  Weight:      Height:        Intake/Output Summary (Last 24 hours) at 04/19/2020 0853 Last data filed at 04/19/2020 0600 Gross per 24 hour  Intake 690 ml  Output --  Net 690 ml   Weight change:  Exam:   General:  Pt is alert, follows commands appropriately, not in acute distress  HEENT: No icterus, No thrush, No neck mass, [place]/AT  Cardiovascular: RRR, S1/S2, no rubs, no gallops  Respiratory: bibasilar crackles. No wheeze  Abdomen: Soft/+BS, non tender, non distended, no guarding  Extremities: Non pitting edema, No lymphangitis, No petechiae, No rashes, no synovitis   Data Reviewed: I have personally  reviewed following labs and imaging studies Basic  Metabolic Panel: Recent Labs  Lab 04/14/20 0556 04/15/20 0520 04/16/20 0517 04/17/20 0735 04/18/20 0441  NA 135 137 139 135 135  K 4.5 4.1 3.7 4.1 4.3  CL 104 107 108 107 103  CO2 24 23 24 23 24   GLUCOSE 130* 142* 84 146* 148*  BUN 29* 29* 28* 23 24*  CREATININE 1.05* 1.13* 1.09* 0.86 0.90  CALCIUM 8.0* 7.9* 8.2* 7.8* 8.2*  MG  --   --   --   --  2.0   Liver Function Tests: Recent Labs  Lab 04/14/20 0556 04/15/20 0520 04/16/20 0517 04/17/20 0735 04/18/20 0441  AST 109* 93* 115* 96* 86*  ALT 63* 57* 71* 70* 69*  ALKPHOS 289* 281* 308* 259* 269*  BILITOT 1.2 1.2 1.7* 1.7* 2.0*  PROT 5.5* 5.3* 6.2* 5.5* 5.7*  ALBUMIN 2.5* 2.4* 2.7* 2.4* 2.5*   No results for input(s): LIPASE, AMYLASE in the last 168 hours. No results for input(s): AMMONIA in the last 168 hours. Coagulation Profile: No results for input(s): INR, PROTIME in the last 168 hours. CBC: Recent Labs  Lab 04/13/20 0456 04/13/20 0456 04/14/20 0556 04/15/20 0520 04/16/20 0517 04/17/20 0735 04/18/20 0441  WBC 1.7*   < > 2.9* 2.9* 4.1 1.8* 2.6*  NEUTROABS 1.3*  --  2.2 2.4 3.2 1.4*  --   HGB 14.0   < > 12.0 11.7* 13.1 12.1 13.1  HCT 42.0   < > 36.8 34.8* 40.3 36.6 39.5  MCV 88.1   < > 88.0 88.1 89.4 87.4 85.9  PLT 59*   < > 55* 51* 49* 43* 47*   < > = values in this interval not displayed.   Cardiac Enzymes: No results for input(s): CKTOTAL, CKMB, CKMBINDEX, TROPONINI in the last 168 hours. BNP: Invalid input(s): POCBNP CBG: Recent Labs  Lab 04/17/20 2019 04/18/20 0852 04/18/20 1300 04/18/20 1725 04/18/20 2114  GLUCAP 148* 116* 205* 138* 125*   HbA1C: No results for input(s): HGBA1C in the last 72 hours. Urine analysis:    Component Value Date/Time   COLORURINE YELLOW 08/05/2011 1107   APPEARANCEUR CLEAR 08/05/2011 1107   LABSPEC 1.025 08/05/2011 1107   PHURINE 6.0 08/05/2011 1107   GLUCOSEU NEGATIVE 08/05/2011 1107   HGBUR NEGATIVE  08/05/2011 1107   BILIRUBINUR NEGATIVE 08/05/2011 1107   KETONESUR NEGATIVE 08/05/2011 1107   PROTEINUR NEGATIVE 08/05/2011 1107   UROBILINOGEN 0.2 08/05/2011 1107   NITRITE NEGATIVE 08/05/2011 1107   LEUKOCYTESUR NEGATIVE 08/05/2011 1107   Sepsis Labs: @LABRCNTIP (procalcitonin:4,lacticidven:4) ) Recent Results (from the past 240 hour(s))  MRSA PCR Screening     Status: None   Collection Time: 04/13/20 11:50 PM   Specimen: Nasopharyngeal  Result Value Ref Range Status   MRSA by PCR NEGATIVE NEGATIVE Final    Comment:        The GeneXpert MRSA Assay (FDA approved for NASAL specimens only), is one component of a comprehensive MRSA colonization surveillance program. It is not intended to diagnose MRSA infection nor to guide or monitor treatment for MRSA infections. Performed at [facility], [address] [place], [address] [place]      Scheduled Meds: . (feeding supplement) PROSource Plus  30 mL Oral BID BM  . albuterol  2 puff Inhalation TID  . baricitinib  4 mg Oral Daily  . Chlorhexidine Gluconate Cloth  6 each Topical Daily  . DULoxetine  60 mg Oral Daily  . feeding supplement  237 mL Oral BID BM  . insulin aspart  0-20 Units  Subcutaneous TID WC  . insulin aspart  0-5 Units Subcutaneous QHS  . insulin aspart  2 Units Subcutaneous TID WC  . levothyroxine  112 mcg Oral Q0600  . methylPREDNISolone (SOLU-MEDROL) injection  125 mg Intravenous Q12H  . metoprolol succinate  25 mg Oral Daily  . montelukast  10 mg Oral QHS   Continuous Infusions:  Procedures/Studies: CT ANGIO CHEST PE W OR WO CONTRAST  Result Date: 04/16/2020 CLINICAL DATA:  Concern for pulmonary embolism. Positive D-dimer. Positive COVID-19. EXAM: CT ANGIOGRAPHY CHEST WITH CONTRAST TECHNIQUE: Multidetector CT imaging of the chest was performed using the standard protocol during bolus administration of intravenous contrast. Multiplanar CT image reconstructions and MIPs were obtained to evaluate the  vascular anatomy. CONTRAST:  OMNIPAQUE IOHEXOL 350 MG/ML SOLN COMPARISON:  None FINDINGS: Cardiovascular: No filling defects within the pulmonary arteries to suggest acute pulmonary embolism. No significant vascular findings. Normal heart size. No pericardial effusion. Mediastinum/Nodes: No axillary or supraclavicular adenopathy. No mediastinal or hilar adenopathy. No pericardial fluid. Esophagus normal. Lungs/Pleura: Diffuse ground-glass airspace disease in the upper lobes and peripheral lower lobes. No pneumothorax. No pleural fluid Upper Abdomen: Limited view of the liver, kidneys, pancreas are unremarkable. Normal adrenal glands. Musculoskeletal: No aggressive osseous lesion Review of the MIP images confirms the above findings. IMPRESSION: 1. No evidence acute pulmonary embolism. 2. Diffuse bilateral ground-glass airspace disease consistent with COVID pneumonia. Electronically Signed   By: Genevive Bi M.D.   On: 04/16/2020 17:02   DG Chest Port 1 View  Result Date: 04/12/2020 CLINICAL DATA:  COVID positive. EXAM: PORTABLE CHEST 1 VIEW COMPARISON:  04/07/2020 FINDINGS: 0649 hours. Lungs are hyperexpanded. Interval progression of patchy peripheral ground-glass airspace opacity compatible with multifocal pneumonia. The cardio pericardial silhouette is enlarged. The visualized bony structures of the thorax show no acute abnormality. Telemetry leads overlie the chest. IMPRESSION: Progression of peripherally predominant patchy ground-glass attenuation consistent with multifocal pneumonia. Electronically Signed   By: Kennith Center M.D.   On: 04/12/2020 07:26   DG Chest Portable 1 View  Result Date: 04/07/2020 CLINICAL DATA:  Cough shortness of breath COVID positive EXAM: PORTABLE CHEST 1 VIEW COMPARISON:  None. FINDINGS: The heart size and mediastinal contours are within normal limits. Hazy patchy airspace opacity seen within the right mid lung and bilateral lung bases. Aortic calcifications are  seen. The visualized skeletal structures are unremarkable. IMPRESSION: Patchy airspace opacity seen within both lungs which be due to infectious etiology. Electronically Signed   By: Jonna Clark M.D.   On: 04/07/2020 18:46   US Abdomen Limited RUQ (LIVER/GB)  Result Date: 04/12/2020 CLINICAL DATA:  Elevated LFTs. EXAM: ULTRASOUND ABDOMEN LIMITED RIGHT UPPER QUADRANT COMPARISON:  None. FINDINGS: Gallbladder: No gallstones or gallbladder wall thickening. No pericholecystic fluid. The sonographer reports no sonographic Murphy's sign. Common bile duct: Diameter: 11 mm Liver: Liver parenchyma is echogenic and heterogeneous. Portal vein is patent on color Doppler imaging with normal direction of blood flow towards the liver. Other: None. IMPRESSION: Extrahepatic biliary duct dilatation. MR I/MRCP could be used to further evaluate as clinically warranted. Heterogeneous echogenic liver, potentially related to steatosis. Electronically Signed   By: Kennith Center M.D.   On: 04/12/2020 11:27    Catarina Hartshorn, DO  [facility]  If 7PM-7AM, please contact night-coverage [email or web address] Password [facility] 04/19/2020, 8:53 AM   LOS: 7 days

## 2020-04-20 DIAGNOSIS — U071 COVID-19: Secondary | ICD-10-CM | POA: Diagnosis not present

## 2020-04-20 DIAGNOSIS — J9601 Acute respiratory failure with hypoxia: Secondary | ICD-10-CM | POA: Diagnosis not present

## 2020-04-20 DIAGNOSIS — Z7189 Other specified counseling: Secondary | ICD-10-CM

## 2020-04-20 DIAGNOSIS — Z515 Encounter for palliative care: Secondary | ICD-10-CM

## 2020-04-20 DIAGNOSIS — R7989 Other specified abnormal findings of blood chemistry: Secondary | ICD-10-CM | POA: Diagnosis not present

## 2020-04-20 LAB — CBC
HCT: 36.6 % (ref 36.0–46.0)
Hemoglobin: 12.3 g/dL (ref 12.0–15.0)
MCH: 29.1 pg (ref 26.0–34.0)
MCHC: 33.6 g/dL (ref 30.0–36.0)
MCV: 86.5 fL (ref 80.0–100.0)
Platelets: 69 10*3/uL — ABNORMAL LOW (ref 150–400)
RBC: 4.23 MIL/uL (ref 3.87–5.11)
RDW: 15.9 % — ABNORMAL HIGH (ref 11.5–15.5)
WBC: 3.7 10*3/uL — ABNORMAL LOW (ref 4.0–10.5)
nRBC: 0 % (ref 0.0–0.2)

## 2020-04-20 LAB — GLUCOSE, CAPILLARY
Glucose-Capillary: 114 mg/dL — ABNORMAL HIGH (ref 70–99)
Glucose-Capillary: 117 mg/dL — ABNORMAL HIGH (ref 70–99)
Glucose-Capillary: 176 mg/dL — ABNORMAL HIGH (ref 70–99)

## 2020-04-20 LAB — COMPREHENSIVE METABOLIC PANEL
ALT: 82 U/L — ABNORMAL HIGH (ref 0–44)
AST: 87 U/L — ABNORMAL HIGH (ref 15–41)
Albumin: 2.3 g/dL — ABNORMAL LOW (ref 3.5–5.0)
Alkaline Phosphatase: 251 U/L — ABNORMAL HIGH (ref 38–126)
Anion gap: 6 (ref 5–15)
BUN: 28 mg/dL — ABNORMAL HIGH (ref 8–23)
CO2: 26 mmol/L (ref 22–32)
Calcium: 8.2 mg/dL — ABNORMAL LOW (ref 8.9–10.3)
Chloride: 103 mmol/L (ref 98–111)
Creatinine, Ser: 0.88 mg/dL (ref 0.44–1.00)
GFR, Estimated: >60 mL/min (ref >60)
Glucose, Bld: 150 mg/dL — ABNORMAL HIGH (ref 70–99)
Potassium: 4.5 mmol/L (ref 3.5–5.1)
Sodium: 135 mmol/L (ref 135–145)
Total Bilirubin: 1.5 mg/dL — ABNORMAL HIGH (ref 0.3–1.2)
Total Protein: 5.3 g/dL — ABNORMAL LOW (ref 6.5–8.1)

## 2020-04-20 LAB — D-DIMER, QUANTITATIVE: D-Dimer, Quant: 6.33 ug/mL-FEU — ABNORMAL HIGH (ref 0.00–0.50)

## 2020-04-20 LAB — C-REACTIVE PROTEIN: CRP: 1 mg/dL — ABNORMAL HIGH (ref <1.0)

## 2020-04-20 LAB — MAGNESIUM: Magnesium: 2.3 mg/dL (ref 1.7–2.4)

## 2020-04-20 LAB — FERRITIN: Ferritin: 468 ng/mL — ABNORMAL HIGH (ref 11–307)

## 2020-04-20 NOTE — Progress Notes (Signed)
PROGRESS NOTE   Angelica Wong  SWF:[id number] DOB: July 07, 1950 DOA: 04/12/2020 PCP: Lenoria Chime, FNP   Chief Complaint  Patient presents with  . Shortness of Breath    Brief Admission History:  69 year old female who is unvaccinated for COVID-19 with hypertension, morbid obesity, hypothyroidism, chronic thrombocytopenia presented with COVID-19 pneumonia diagnosed on 04/02/2020.  She is status post monoclonal antibody infusion given 04/08/2020 and has been admitted with acute hypoxic respiratory failure secondary to multifocal Covid pneumonia with high oxygen requirements.  She has completed a course of IV levofloxacin.  She remains on IV steroids baricitinib and supportive measures.  Oxygen is slow to wean.  She has completed remdesivir.  Assessment & Plan:   Active Problems:   Pneumonia due to COVID-19 virus   Acute respiratory failure with hypoxia (HCC)   Thrombocytopenia (HCC)   Obesity, Class III, BMI 40-49.9 (morbid obesity) (HCC)   Essential hypertension   Hypothyroidism   Elevated LFTs   Goals of care, counseling/discussion   Palliative care by specialist   DNR (do not resuscitate) discussion   1. Acute respiratory failures with hypoxia secondary to severe Covid 19 multifocal pneumonia-patient sustained severe parenchymal lung damage and has had high oxygen requirements but is slowly weaning oxygen.  She has completed 5 days of remdesivir.  She has completed 5 days of IV levofloxacin.  She is on IV steroids and slowly improving clinically.  Continue supportive measures.  Should be able to discontinue steroids after 04/22/2020. 2. Chronic thrombocytopenia-follow closely by hematologist in [place].  We are monitoring platelets daily no active bleeding.  Follow. 3. Hypothyroidism-stable on home levothyroxine. 4. Elevated LFTs-trending down with supportive measures. 5. Essential hypertension-stable on home metoprolol succinate. 6. Depression/anxiety-stable on home  Cymbalta. 7. Morbid obesity continue supportive measures counseling for lifestyle modifications. 8. Steroid-induced hyperglycemia-monitoring blood glucose closely while on IV steroids  DVT prophylaxis: SCDs Code Status: Full Family Communication: Telephone call to husband Disposition: Inpatient  Status is: Inpatient  Remains inpatient appropriate because:Inpatient level of care appropriate due to severity of illness   Dispo: The patient is from: Home              Anticipated d/c is to: Home              Anticipated d/c date is: > 3 days              Patient currently is not medically stable to d/c.   Consultants:   n/a  Procedures:   PICC line placement   Antimicrobials:  Levofloxacin x 5 doses completed   Subjective: Pt is reporting some difficulty sleeping, still coughing, mostly nonproductive, no chest pain, no SOB.    Objective: Vitals:   04/20/20 0300 04/20/20 0400 04/20/20 0500 04/20/20 0853  BP: 139/73 (!) 152/72    Pulse: 68 65    Resp: 16 15    Temp:  98.9 F (37.2 C)    TempSrc:  Oral    SpO2: 93% 94%  91%  Weight:   123.5 kg   Height:        Intake/Output Summary (Last 24 hours) at 04/20/2020 0920 Last data filed at 04/20/2020 0500 Gross per 24 hour  Intake 470 ml  Output --  Net 470 ml   Filed Weights   04/14/20 0003 04/17/20 0541 04/20/20 0500  Weight: 122.4 kg 124.1 kg 123.5 kg    Examination:  General exam: lying in bed, awake, alert, NAD, Appears calm and comfortable  Respiratory system: rare rales  heard. Respiratory effort normal. Cardiovascular system: normal S1 & S2 heard. No JVD, murmurs, rubs, gallops or clicks. No pedal edema. Gastrointestinal system: Abdomen is nondistended, soft and nontender. No organomegaly or masses felt. Normal bowel sounds heard. Central nervous system: Alert and oriented. No focal neurological deficits. Extremities: Symmetric 5 x 5 power. Skin: No rashes, lesions or ulcers Psychiatry: Judgement and  insight appear normal. Mood & affect appropriate.   Data Reviewed: I have personally reviewed following labs and imaging studies  CBC: Recent Labs  Lab 04/14/20 0556 04/14/20 0556 04/15/20 0520 04/16/20 0517 04/17/20 0735 04/18/20 0441 04/20/20 0511  WBC 2.9*   < > 2.9* 4.1 1.8* 2.6* 3.7*  NEUTROABS 2.2  --  2.4 3.2 1.4*  --   --   HGB 12.0   < > 11.7* 13.1 12.1 13.1 12.3  HCT 36.8   < > 34.8* 40.3 36.6 39.5 36.6  MCV 88.0   < > 88.1 89.4 87.4 85.9 86.5  PLT 55*   < > 51* 49* 43* 47* 69*   < > = values in this interval not displayed.    Basic Metabolic Panel: Recent Labs  Lab 04/15/20 0520 04/16/20 0517 04/17/20 0735 04/18/20 0441 04/20/20 0511  NA 137 139 135 135 135  K 4.1 3.7 4.1 4.3 4.5  CL 107 108 107 103 103  CO2 23 24 23 24 26   GLUCOSE 142* 84 146* 148* 150*  BUN 29* 28* 23 24* 28*  CREATININE 1.13* 1.09* 0.86 0.90 0.88  CALCIUM 7.9* 8.2* 7.8* 8.2* 8.2*  MG  --   --   --  2.0 2.3    GFR: Estimated Creatinine Clearance: 81.9 mL/min (by C-G formula based on SCr of 0.88 mg/dL).  Liver Function Tests: Recent Labs  Lab 04/15/20 0520 04/16/20 0517 04/17/20 0735 04/18/20 0441 04/20/20 0511  AST 93* 115* 96* 86* 87*  ALT 57* 71* 70* 69* 82*  ALKPHOS 281* 308* 259* 269* 251*  BILITOT 1.2 1.7* 1.7* 2.0* 1.5*  PROT 5.3* 6.2* 5.5* 5.7* 5.3*  ALBUMIN 2.4* 2.7* 2.4* 2.5* 2.3*    CBG: Recent Labs  Lab 04/18/20 2114 04/19/20 0911 04/19/20 1703 04/19/20 2049 04/20/20 0854  GLUCAP 125* 132* 138* 143* 114*    Recent Results (from the past 240 hour(s))  MRSA PCR Screening     Status: None   Collection Time: 04/13/20 11:50 PM   Specimen: Nasopharyngeal  Result Value Ref Range Status   MRSA by PCR NEGATIVE NEGATIVE Final    Comment:        The GeneXpert MRSA Assay (FDA approved for NASAL specimens only), is one component of a comprehensive MRSA colonization surveillance program. It is not intended to diagnose MRSA infection nor to guide  or monitor treatment for MRSA infections. Performed at [facility], [address] [place], [place]      Radiology Studies: DG Chest Portable 1 View  Result Date: 04/19/2020 CLINICAL DATA:  PICC line confirmation.  Shortness of breath. EXAM: PORTABLE CHEST 1 VIEW COMPARISON:  April 12, 2020 FINDINGS: A new right PICC line terminates in the central SVC just above the caval atrial junction. Bilateral primarily peripheral pulmonary infiltrates are more pronounced in the interval. The cardiomediastinal silhouette is stable. No pneumothorax. IMPRESSION: 1. The new right PICC line is in good position. 2. Peripheral bilateral pulmonary infiltrates are more pronounced in the interval consistent with COVID-19 pneumonia given the patient's history of a COVID-19 positive status. Electronically Signed   By: April 14, 2020  Mayford Knife III M.D   On: 04/19/2020 17:07   [place] EKG SITE RITE  Result Date: 04/19/2020 If Site Rite image not attached, placement could not be confirmed due to current cardiac rhythm.  Scheduled Meds: . (feeding supplement) PROSource Plus  30 mL Oral BID BM  . albuterol  2 puff Inhalation TID  . baricitinib  4 mg Oral Daily  . Chlorhexidine Gluconate Cloth  6 each Topical Daily  . DULoxetine  60 mg Oral Daily  . feeding supplement  237 mL Oral BID BM  . insulin aspart  0-20 Units Subcutaneous TID WC  . insulin aspart  0-5 Units Subcutaneous QHS  . insulin aspart  2 Units Subcutaneous TID WC  . levothyroxine  112 mcg Oral Q0600  . methylPREDNISolone (SOLU-MEDROL) injection  125 mg Intravenous Q12H  . metoprolol succinate  25 mg Oral Daily  . montelukast  10 mg Oral QHS  . sodium chloride flush  10-40 mL Intracatheter Q12H   Continuous Infusions:   LOS: 8 days   Critical Care Procedure Note Authorized and Performed by: Maryln Manuel MD  Total Critical Care time:  34 mins Due to a high probability of clinically significant, life threatening deterioration, the patient  required my highest level of preparedness to intervene emergently and I personally spent this critical care time directly and personally managing the patient.  This critical care time included obtaining a history; examining the patient, pulse oximetry; ordering and review of studies; arranging urgent treatment with development of a management plan; evaluation of patient's response of treatment; frequent reassessment; and discussions with other providers.  This critical care time was performed to assess and manage the high probability of imminent and life threatening deterioration that could result in multi-organ failure.  It was exclusive of separately billable procedures and treating other patients and teaching time.   Standley Dakins, MD How to contact the [facility] Attending or Consulting provider 7A - 7P or covering provider during after hours 7P -7A, for this patient?  1. Check the care team in [facility] and look for a) attending/consulting TR[facility] provider listed and b) the [facility] team listed 2. Log into [email or web address] and use [place]'s universal password to access. If you do not have the password, please contact the hospital operator. 3. Locate the [facility] provider you are looking for under [facility] and page to a number that you can be directly reached. 4. If you still have difficulty reaching the provider, please page the [facility] (Director on Call) for the Hospitalists listed on amion for assistance.  04/20/2020, 9:20 AM

## 2020-04-21 ENCOUNTER — Inpatient Hospital Stay (HOSPITAL_COMMUNITY): Payer: Medicare Other

## 2020-04-21 DIAGNOSIS — J9601 Acute respiratory failure with hypoxia: Secondary | ICD-10-CM | POA: Diagnosis not present

## 2020-04-21 DIAGNOSIS — U071 COVID-19: Secondary | ICD-10-CM | POA: Diagnosis not present

## 2020-04-21 DIAGNOSIS — Z7189 Other specified counseling: Secondary | ICD-10-CM | POA: Diagnosis not present

## 2020-04-21 DIAGNOSIS — R7989 Other specified abnormal findings of blood chemistry: Secondary | ICD-10-CM | POA: Diagnosis not present

## 2020-04-21 LAB — CBC
HCT: 36.8 % (ref 36.0–46.0)
Hemoglobin: 12.3 g/dL (ref 12.0–15.0)
MCH: 29.4 pg (ref 26.0–34.0)
MCHC: 33.4 g/dL (ref 30.0–36.0)
MCV: 88 fL (ref 80.0–100.0)
Platelets: 65 10*3/uL — ABNORMAL LOW (ref 150–400)
RBC: 4.18 MIL/uL (ref 3.87–5.11)
RDW: 15.6 % — ABNORMAL HIGH (ref 11.5–15.5)
WBC: 2.9 10*3/uL — ABNORMAL LOW (ref 4.0–10.5)
nRBC: 0 % (ref 0.0–0.2)

## 2020-04-21 LAB — COMPREHENSIVE METABOLIC PANEL
ALT: 91 U/L — ABNORMAL HIGH (ref 0–44)
AST: 81 U/L — ABNORMAL HIGH (ref 15–41)
Albumin: 2.3 g/dL — ABNORMAL LOW (ref 3.5–5.0)
Alkaline Phosphatase: 230 U/L — ABNORMAL HIGH (ref 38–126)
Anion gap: 5 (ref 5–15)
BUN: 29 mg/dL — ABNORMAL HIGH (ref 8–23)
CO2: 26 mmol/L (ref 22–32)
Calcium: 8 mg/dL — ABNORMAL LOW (ref 8.9–10.3)
Chloride: 104 mmol/L (ref 98–111)
Creatinine, Ser: 0.87 mg/dL (ref 0.44–1.00)
GFR, Estimated: >60 mL/min (ref >60)
Glucose, Bld: 132 mg/dL — ABNORMAL HIGH (ref 70–99)
Potassium: 4.4 mmol/L (ref 3.5–5.1)
Sodium: 135 mmol/L (ref 135–145)
Total Bilirubin: 1.6 mg/dL — ABNORMAL HIGH (ref 0.3–1.2)
Total Protein: 5.2 g/dL — ABNORMAL LOW (ref 6.5–8.1)

## 2020-04-21 LAB — GLUCOSE, CAPILLARY
Glucose-Capillary: 130 mg/dL — ABNORMAL HIGH (ref 70–99)
Glucose-Capillary: 161 mg/dL — ABNORMAL HIGH (ref 70–99)
Glucose-Capillary: 115 mg/dL — ABNORMAL HIGH (ref 70–99)
Glucose-Capillary: 165 mg/dL — ABNORMAL HIGH (ref 70–99)

## 2020-04-21 LAB — D-DIMER, QUANTITATIVE: D-Dimer, Quant: 8.25 ug/mL-FEU — ABNORMAL HIGH (ref 0.00–0.50)

## 2020-04-21 NOTE — Progress Notes (Signed)
PROGRESS NOTE   Angelica Wong  ZOX:[id number] DOB: April 06, 1951 DOA: 04/12/2020 PCP: Lenoria Chime, FNP   Chief Complaint  Patient presents with  . Shortness of Breath   Brief Admission History:  69 year old female who is unvaccinated for COVID-19 with hypertension, morbid obesity, hypothyroidism, chronic thrombocytopenia presented with COVID-19 pneumonia diagnosed on 04/02/2020.  She is status post monoclonal antibody infusion given 04/08/2020 and has been admitted with acute hypoxic respiratory failure secondary to multifocal Covid pneumonia with high oxygen requirements.  She has completed a course of IV levofloxacin.  She remains on IV steroids baricitinib and supportive measures.  Oxygen is slow to wean.  She has completed remdesivir.  Assessment & Plan:   Active Problems:   Pneumonia due to COVID-19 virus   Acute respiratory failure with hypoxia (HCC)   Thrombocytopenia (HCC)   Obesity, Class III, BMI 40-49.9 (morbid obesity) (HCC)   Essential hypertension   Hypothyroidism   Elevated LFTs   Goals of care, counseling/discussion   Palliative care by specialist   DNR (do not resuscitate) discussion  1. Acute respiratory failures with hypoxia secondary to severe Covid 19 multifocal pneumonia-patient sustained severe parenchymal lung damage and has had high oxygen requirements but is slowly weaning oxygen.  She is now down to 9L/min HFNC.  She has completed 5 days of remdesivir.  She has completed 5 days of IV levofloxacin.  She is on IV steroids and slowly improving clinically.  Continue supportive measures.  Should be able to discontinue steroids after 04/22/2020.  D dimer has been trending back up.  I have ordered a venous duplex study of both lower extremities to investigate for DVT.   2. Chronic thrombocytopenia-followed closely by hematologist in [place].  We are monitoring platelets daily no active bleeding.  Follow.  Pt not a candidate for enoxaparin given platelets less than  100.  3. Hypothyroidism-stable on home levothyroxine. 4. Elevated LFTs-trending down with supportive measures. 5. Essential hypertension-stable on home metoprolol succinate. 6. Depression/anxiety-stable on home Cymbalta. 7. Morbid obesity continue supportive measures counseling for lifestyle modifications. 8. Steroid-induced hyperglycemia-monitoring blood glucose closely while on IV steroids  DVT prophylaxis: SCDs Code Status: Full Family Communication: Telephone call to husband 11/21 Disposition: Inpatient  Status is: Inpatient  Remains inpatient appropriate because:Inpatient level of care appropriate due to severity of illness  Dispo: The patient is from: Home              Anticipated d/c is to: Home              Anticipated d/c date is: > 3 days              Patient currently is not medically stable to d/c.  Consultants:   n/a  Procedures:   PICC line placement   Antimicrobials:  Levofloxacin x 5 doses completed   Subjective: Pt is reporting some difficulty sleeping, still coughing, mostly nonproductive, no chest pain, no SOB.    Objective: Vitals:   04/21/20 0500 04/21/20 0600 04/21/20 0800 04/21/20 0830  BP: (!) 143/74 (!) 149/68    Pulse: 65 63    Resp: 16 16    Temp:   98.3 F (36.8 C)   TempSrc:   Oral   SpO2: 98% 100%  90%  Weight:      Height:        Intake/Output Summary (Last 24 hours) at 04/21/2020 0955 Last data filed at 04/21/2020 0400 Gross per 24 hour  Intake --  Output 860 ml  Net -  860 ml   Filed Weights   04/14/20 0003 04/17/20 0541 04/20/20 0500  Weight: 122.4 kg 124.1 kg 123.5 kg    Examination:  General exam: lying in bed, awake, alert, NAD, Appears calm and comfortable  Respiratory system: rare rales heard. Respiratory effort normal. Cardiovascular system: normal S1 & S2 heard. No JVD, murmurs, rubs, gallops or clicks. No pedal edema. Gastrointestinal system: Abdomen is nondistended, soft and nontender. No organomegaly or  masses felt. Normal bowel sounds heard. Central nervous system: Alert and oriented. No focal neurological deficits. Extremities: Symmetric 5 x 5 power. Skin: No rashes, lesions or ulcers Psychiatry: Judgement and insight appear normal. Mood & affect appropriate.   Data Reviewed: I have personally reviewed following labs and imaging studies  CBC: Recent Labs  Lab 04/15/20 0520 04/15/20 0520 04/16/20 0517 04/17/20 0735 04/18/20 0441 04/20/20 0511 04/21/20 0409  WBC 2.9*   < > 4.1 1.8* 2.6* 3.7* 2.9*  NEUTROABS 2.4  --  3.2 1.4*  --   --   --   HGB 11.7*   < > 13.1 12.1 13.1 12.3 12.3  HCT 34.8*   < > 40.3 36.6 39.5 36.6 36.8  MCV 88.1   < > 89.4 87.4 85.9 86.5 88.0  PLT 51*   < > 49* 43* 47* 69* 65*   < > = values in this interval not displayed.    Basic Metabolic Panel: Recent Labs  Lab 04/16/20 0517 04/17/20 0735 04/18/20 0441 04/20/20 0511 04/21/20 0409  NA 139 135 135 135 135  K 3.7 4.1 4.3 4.5 4.4  CL 108 107 103 103 104  CO2 24 23 24 26 26   GLUCOSE 84 146* 148* 150* 132*  BUN 28* 23 24* 28* 29*  CREATININE 1.09* 0.86 0.90 0.88 0.87  CALCIUM 8.2* 7.8* 8.2* 8.2* 8.0*  MG  --   --  2.0 2.3  --     GFR: Estimated Creatinine Clearance: 82.8 mL/min (by C-G formula based on SCr of 0.87 mg/dL).  Liver Function Tests: Recent Labs  Lab 04/16/20 0517 04/17/20 0735 04/18/20 0441 04/20/20 0511 04/21/20 0409  AST 115* 96* 86* 87* 81*  ALT 71* 70* 69* 82* 91*  ALKPHOS 308* 259* 269* 251* 230*  BILITOT 1.7* 1.7* 2.0* 1.5* 1.6*  PROT 6.2* 5.5* 5.7* 5.3* 5.2*  ALBUMIN 2.7* 2.4* 2.5* 2.3* 2.3*    CBG: Recent Labs  Lab 04/19/20 2049 04/20/20 0854 04/20/20 1241 04/20/20 2049 04/21/20 0808  GLUCAP 143* 114* 117* 176* 130*    Recent Results (from the past 240 hour(s))  MRSA PCR Screening     Status: None   Collection Time: 04/13/20 11:50 PM   Specimen: Nasopharyngeal  Result Value Ref Range Status   MRSA by PCR NEGATIVE NEGATIVE Final    Comment:         The GeneXpert MRSA Assay (FDA approved for NASAL specimens only), is one component of a comprehensive MRSA colonization surveillance program. It is not intended to diagnose MRSA infection nor to guide or monitor treatment for MRSA infections. Performed at [facility], [address], [place], [place]      Radiology Studies: DG Chest Portable 1 View  Result Date: 04/19/2020 CLINICAL DATA:  PICC line confirmation.  Shortness of breath. EXAM: PORTABLE CHEST 1 VIEW COMPARISON:  April 12, 2020 FINDINGS: A new right PICC line terminates in the central SVC just above the caval atrial junction. Bilateral primarily peripheral pulmonary infiltrates are more pronounced in the interval. The cardiomediastinal silhouette  is stable. No pneumothorax. IMPRESSION: 1. The new right PICC line is in good position. 2. Peripheral bilateral pulmonary infiltrates are more pronounced in the interval consistent with COVID-19 pneumonia given the patient's history of a COVID-19 positive status. Electronically Signed   By: Gerome Sam III M.D   On: 04/19/2020 17:07   [place] EKG SITE RITE  Result Date: 04/19/2020 If Site Rite image not attached, placement could not be confirmed due to current cardiac rhythm.  Scheduled Meds: . (feeding supplement) PROSource Plus  30 mL Oral BID BM  . albuterol  2 puff Inhalation TID  . baricitinib  4 mg Oral Daily  . Chlorhexidine Gluconate Cloth  6 each Topical Daily  . DULoxetine  60 mg Oral Daily  . feeding supplement  237 mL Oral BID BM  . insulin aspart  0-20 Units Subcutaneous TID WC  . insulin aspart  0-5 Units Subcutaneous QHS  . insulin aspart  2 Units Subcutaneous TID WC  . levothyroxine  112 mcg Oral Q0600  . methylPREDNISolone (SOLU-MEDROL) injection  125 mg Intravenous Q12H  . metoprolol succinate  25 mg Oral Daily  . montelukast  10 mg Oral QHS  . sodium chloride flush  10-40 mL Intracatheter Q12H   Continuous Infusions:   LOS: 9 days    Critical Care Procedure Note Authorized and Performed by: Maryln Manuel MD  Total Critical Care time:  31 mins Due to a high probability of clinically significant, life threatening deterioration, the patient required my highest level of preparedness to intervene emergently and I personally spent this critical care time directly and personally managing the patient.  This critical care time included obtaining a history; examining the patient, pulse oximetry; ordering and review of studies; arranging urgent treatment with development of a management plan; evaluation of patient's response of treatment; frequent reassessment; and discussions with other providers.  This critical care time was performed to assess and manage the high probability of imminent and life threatening deterioration that could result in multi-organ failure.  It was exclusive of separately billable procedures and treating other patients and teaching time.   Standley Dakins, MD How to contact the [facility] Attending or Consulting provider 7A - 7P or covering provider during after hours 7P -7A, for this patient?  1. Check the care team in [facility] and look for a) attending/consulting TRH provider listed and b) the [facility] team listed 2. Log into [email or web address] and use [place]'s universal password to access. If you do not have the password, please contact the hospital operator. 3. Locate the [facility] provider you are looking for under [facility] and page to a number that you can be directly reached. 4. If you still have difficulty reaching the provider, please page the [facility] (Director on Call) for the Hospitalists listed on amion for assistance.  04/21/2020, 9:55 AM

## 2020-04-22 DIAGNOSIS — J9601 Acute respiratory failure with hypoxia: Secondary | ICD-10-CM | POA: Diagnosis not present

## 2020-04-22 DIAGNOSIS — I10 Essential (primary) hypertension: Secondary | ICD-10-CM | POA: Diagnosis not present

## 2020-04-22 DIAGNOSIS — R7989 Other specified abnormal findings of blood chemistry: Secondary | ICD-10-CM | POA: Diagnosis not present

## 2020-04-22 DIAGNOSIS — U071 COVID-19: Secondary | ICD-10-CM | POA: Diagnosis not present

## 2020-04-22 LAB — COMPREHENSIVE METABOLIC PANEL
ALT: 96 U/L — ABNORMAL HIGH (ref 0–44)
AST: 75 U/L — ABNORMAL HIGH (ref 15–41)
Albumin: 2.3 g/dL — ABNORMAL LOW (ref 3.5–5.0)
Alkaline Phosphatase: 225 U/L — ABNORMAL HIGH (ref 38–126)
Anion gap: 4 — ABNORMAL LOW (ref 5–15)
BUN: 32 mg/dL — ABNORMAL HIGH (ref 8–23)
CO2: 27 mmol/L (ref 22–32)
Calcium: 8 mg/dL — ABNORMAL LOW (ref 8.9–10.3)
Chloride: 103 mmol/L (ref 98–111)
Creatinine, Ser: 0.83 mg/dL (ref 0.44–1.00)
GFR, Estimated: >60 mL/min (ref >60)
Glucose, Bld: 148 mg/dL — ABNORMAL HIGH (ref 70–99)
Potassium: 4.7 mmol/L (ref 3.5–5.1)
Sodium: 134 mmol/L — ABNORMAL LOW (ref 135–145)
Total Bilirubin: 1.6 mg/dL — ABNORMAL HIGH (ref 0.3–1.2)
Total Protein: 5.2 g/dL — ABNORMAL LOW (ref 6.5–8.1)

## 2020-04-22 LAB — GLUCOSE, CAPILLARY
Glucose-Capillary: 103 mg/dL — ABNORMAL HIGH (ref 70–99)
Glucose-Capillary: 199 mg/dL — ABNORMAL HIGH (ref 70–99)
Glucose-Capillary: 95 mg/dL (ref 70–99)
Glucose-Capillary: 120 mg/dL — ABNORMAL HIGH (ref 70–99)

## 2020-04-22 LAB — CBC
HCT: 37.3 % (ref 36.0–46.0)
Hemoglobin: 12.5 g/dL (ref 12.0–15.0)
MCH: 29.1 pg (ref 26.0–34.0)
MCHC: 33.5 g/dL (ref 30.0–36.0)
MCV: 86.7 fL (ref 80.0–100.0)
Platelets: 73 10*3/uL — ABNORMAL LOW (ref 150–400)
RBC: 4.3 MIL/uL (ref 3.87–5.11)
RDW: 15.8 % — ABNORMAL HIGH (ref 11.5–15.5)
WBC: 2.8 10*3/uL — ABNORMAL LOW (ref 4.0–10.5)
nRBC: 0 % (ref 0.0–0.2)

## 2020-04-22 LAB — D-DIMER, QUANTITATIVE: D-Dimer, Quant: 8.88 ug/mL-FEU — ABNORMAL HIGH (ref 0.00–0.50)

## 2020-04-22 NOTE — Progress Notes (Addendum)
PROGRESS NOTE   Angelica Wong  ACZ:[id number] DOB: 07-27-1950 DOA: 04/12/2020 PCP: Lenoria Chime, FNP   Chief Complaint  Patient presents with  . Shortness of Breath   Brief Admission History:  69 year old female who is unvaccinated for COVID-19 with hypertension, morbid obesity, hypothyroidism, chronic thrombocytopenia presented with COVID-19 pneumonia diagnosed on 04/02/2020.  She is status post monoclonal antibody infusion given 04/08/2020 and has been admitted with acute hypoxic respiratory failure secondary to multifocal Covid pneumonia with high oxygen requirements.  She has completed a course of IV levofloxacin.  She remains on IV steroids baricitinib and supportive measures.  Oxygen is slow to wean.  She has completed remdesivir.  Assessment & Plan:   Active Problems:   Pneumonia due to COVID-19 virus   Acute respiratory failure with hypoxia (HCC)   Thrombocytopenia (HCC)   Obesity, Class III, BMI 40-49.9 (morbid obesity) (HCC)   Essential hypertension   Hypothyroidism   Elevated LFTs   Goals of care, counseling/discussion   Palliative care by specialist   DNR (do not resuscitate) discussion  1. Acute respiratory failures with hypoxia secondary to severe Covid 19 multifocal pneumonia-patient has sustained severe parenchymal lung damage and has had high oxygen requirements but is slowly weaning oxygen.  She is now down to 8L/min HFNC.  She has completed 5 days of remdesivir.  She has completed 5 days of IV levofloxacin.  She is on IV steroids and slowly improving clinically.  Continue supportive measures.  Should be able to discontinue steroids after 04/22/2020.  D dimer has been trending back up.  I have ordered a venous duplex study of both lower extremities which was negative for DVT.  Pt had a negative CTA chest 11/17 with no finding of PE.  Pt currently not having any chest pain symptoms or pleuritic chest pain.   2. Chronic thrombocytopenia-followed closely by hematologist  in [place].  We are monitoring platelets daily no active bleeding.  Follow.  Pt not a candidate for enoxaparin given platelets less than 100.  3. Hypothyroidism-stable on home levothyroxine. 4. Elevated LFTs-trending down with supportive measures. 5. Essential hypertension-stable on home metoprolol succinate. 6. Depression/anxiety-stable on home Cymbalta. 7. Morbid obesity continue supportive measures counseling for lifestyle modifications. 8. Steroid-induced hyperglycemia-monitoring blood glucose closely while on IV steroids  DVT prophylaxis: SCDs Code Status: Full Family Communication: Telephone call to husband 11/21, 11/23 Disposition: Inpatient  Status is: Inpatient  Remains inpatient appropriate because:Inpatient level of care appropriate due to severity of illness  Dispo: The patient is from: Home              Anticipated d/c is to: Home              Anticipated d/c date is: 2 days              Patient currently is not medically stable to d/c.  Consultants:   n/a  Procedures:   PICC line placement   Antimicrobials:  Levofloxacin x 5 doses completed   Subjective: Pt reports that she can feel that her breathing is slowly getting better.    Objective: Vitals:   04/22/20 0400 04/22/20 0500 04/22/20 0700 04/22/20 0830  BP: (!) 146/75 (!) 147/76 (!) 150/71   Pulse: 66 66 75   Resp: 16 17 18    Temp:   97.8 F (36.6 C) 97.9 F (36.6 C)  TempSrc:   Oral Oral  SpO2: 94% 95% 95% 90%  Weight:      Height:  Intake/Output Summary (Last 24 hours) at 04/22/2020 1051 Last data filed at 04/22/2020 0700 Gross per 24 hour  Intake 480 ml  Output 1750 ml  Net -1270 ml   Filed Weights   04/14/20 0003 04/17/20 0541 04/20/20 0500  Weight: 122.4 kg 124.1 kg 123.5 kg    Examination:  General exam: lying in bed, awake, alert, NAD, Appears calm and comfortable  Respiratory system: rare rales heard. Respiratory effort normal. Cardiovascular system: normal S1 & S2  heard. No JVD, murmurs, rubs, gallops or clicks. No pedal edema. Gastrointestinal system: Abdomen is nondistended, soft and nontender. No organomegaly or masses felt. Normal bowel sounds heard. Central nervous system: Alert and oriented. No focal neurological deficits. Extremities: Symmetric 5 x 5 power. Skin: No rashes, lesions or ulcers Psychiatry: Judgement and insight appear normal. Mood & affect appropriate.   Data Reviewed: I have personally reviewed following labs and imaging studies  CBC: Recent Labs  Lab 04/16/20 0517 04/16/20 0517 04/17/20 0735 04/18/20 0441 04/20/20 0511 04/21/20 0409 04/22/20 0554  WBC 4.1   < > 1.8* 2.6* 3.7* 2.9* 2.8*  NEUTROABS 3.2  --  1.4*  --   --   --   --   HGB 13.1   < > 12.1 13.1 12.3 12.3 12.5  HCT 40.3   < > 36.6 39.5 36.6 36.8 37.3  MCV 89.4   < > 87.4 85.9 86.5 88.0 86.7  PLT 49*   < > 43* 47* 69* 65* 73*   < > = values in this interval not displayed.    Basic Metabolic Panel: Recent Labs  Lab 04/17/20 0735 04/18/20 0441 04/20/20 0511 04/21/20 0409 04/22/20 0554  NA 135 135 135 135 134*  K 4.1 4.3 4.5 4.4 4.7  CL 107 103 103 104 103  CO2 23 24 26 26 27   GLUCOSE 146* 148* 150* 132* 148*  BUN 23 24* 28* 29* 32*  CREATININE 0.86 0.90 0.88 0.87 0.83  CALCIUM 7.8* 8.2* 8.2* 8.0* 8.0*  MG  --  2.0 2.3  --   --     GFR: Estimated Creatinine Clearance: 86.8 mL/min (by C-G formula based on SCr of 0.83 mg/dL).  Liver Function Tests: Recent Labs  Lab 04/17/20 0735 04/18/20 0441 04/20/20 0511 04/21/20 0409 04/22/20 0554  AST 96* 86* 87* 81* 75*  ALT 70* 69* 82* 91* 96*  ALKPHOS 259* 269* 251* 230* 225*  BILITOT 1.7* 2.0* 1.5* 1.6* 1.6*  PROT 5.5* 5.7* 5.3* 5.2* 5.2*  ALBUMIN 2.4* 2.5* 2.3* 2.3* 2.3*    CBG: Recent Labs  Lab 04/20/20 2049 04/21/20 0808 04/21/20 1125 04/21/20 1628 04/21/20 2211  GLUCAP 176* 130* 161* 115* 165*    Recent Results (from the past 240 hour(s))  MRSA PCR Screening     Status: None    Collection Time: 04/13/20 11:50 PM   Specimen: Nasopharyngeal  Result Value Ref Range Status   MRSA by PCR NEGATIVE NEGATIVE Final    Comment:        The GeneXpert MRSA Assay (FDA approved for NASAL specimens only), is one component of a comprehensive MRSA colonization surveillance program. It is not intended to diagnose MRSA infection nor to guide or monitor treatment for MRSA infections. Performed at [facility], [address], [place], [place]      Radiology Studies: 81829 Venous Img Lower Bilateral (DVT)  Result Date: 04/21/2020 CLINICAL DATA:  69 year old female with elevated D-dimer. EXAM: BILATERAL LOWER EXTREMITY VENOUS DOPPLER ULTRASOUND TECHNIQUE: Gray-scale sonography with graded  compression, as well as color Doppler and duplex ultrasound were performed to evaluate the lower extremity deep venous systems from the level of the common femoral vein and including the common femoral, femoral, profunda femoral, popliteal and calf veins including the posterior tibial, peroneal and gastrocnemius veins when visible. The superficial great saphenous vein was also interrogated. Spectral Doppler was utilized to evaluate flow at rest and with distal augmentation maneuvers in the common femoral, femoral and popliteal veins. COMPARISON:  None. FINDINGS: RIGHT LOWER EXTREMITY Common Femoral Vein: No evidence of thrombus. Normal compressibility, respiratory phasicity and response to augmentation. Saphenofemoral Junction: No evidence of thrombus. Normal compressibility and flow on color Doppler imaging. Profunda Femoral Vein: No evidence of thrombus. Normal compressibility and flow on color Doppler imaging. Femoral Vein: No evidence of thrombus. Normal compressibility, respiratory phasicity and response to augmentation. Popliteal Vein: No evidence of thrombus. Normal compressibility, respiratory phasicity and response to augmentation. Calf Veins: No evidence of thrombus. Normal compressibility  and flow on color Doppler imaging. Superficial Great Saphenous Vein: No evidence of thrombus. Normal compressibility. Other Findings:  None. LEFT LOWER EXTREMITY Common Femoral Vein: No evidence of thrombus. Normal compressibility, respiratory phasicity and response to augmentation. Saphenofemoral Junction: No evidence of thrombus. Normal compressibility and flow on color Doppler imaging. Profunda Femoral Vein: No evidence of thrombus. Normal compressibility and flow on color Doppler imaging. Femoral Vein: No evidence of thrombus. Normal compressibility, respiratory phasicity and response to augmentation. Popliteal Vein: No evidence of thrombus. Normal compressibility, respiratory phasicity and response to augmentation. Calf Veins: No evidence of thrombus. Normal compressibility and flow on color Doppler imaging. Superficial Great Saphenous Vein: No evidence of thrombus. Normal compressibility. Other Findings:  None. IMPRESSION: No evidence of bilateral lower extremity deep venous thrombosis. Marliss Cootsylan Suttle, MD [facility] Electronically Signed   By: Marliss Cootsylan  Suttle MD   On: 04/21/2020 10:16   Scheduled Meds: . (feeding supplement) PROSource Plus  30 mL Oral BID BM  . albuterol  2 puff Inhalation TID  . baricitinib  4 mg Oral Daily  . Chlorhexidine Gluconate Cloth  6 each Topical Daily  . DULoxetine  60 mg Oral Daily  . feeding supplement  237 mL Oral BID BM  . insulin aspart  0-20 Units Subcutaneous TID WC  . insulin aspart  0-5 Units Subcutaneous QHS  . insulin aspart  2 Units Subcutaneous TID WC  . levothyroxine  112 mcg Oral Q0600  . methylPREDNISolone (SOLU-MEDROL) injection  125 mg Intravenous Q12H  . metoprolol succinate  25 mg Oral Daily  . montelukast  10 mg Oral QHS  . sodium chloride flush  10-40 mL Intracatheter Q12H   Continuous Infusions:   LOS: 10 days   Critical Care Procedure Note Authorized and Performed by: Maryln Manuel. Yuvin Bussiere MD   Total Critical Care time:  30 mins Due to a high probability of clinically significant, life threatening deterioration, the patient required my highest level of preparedness to intervene emergently and I personally spent this critical care time directly and personally managing the patient.  This critical care time included obtaining a history; examining the patient, pulse oximetry; ordering and review of studies; arranging urgent treatment with development of a management plan; evaluation of patient's response of treatment; frequent reassessment; and discussions with other providers.  This critical care time was performed to assess and manage the high probability of imminent and life threatening deterioration that could result in multi-organ failure.  It was exclusive of separately billable procedures and  treating other patients and teaching time.   Standley Dakins, MD How to contact the [facility] Attending or Consulting provider 7A - 7P or covering provider during after hours 7P -7A, for this patient?  1. Check the care team in [facility] and look for a) attending/consulting TRH provider listed and b) the [facility] team listed 2. Log into [email or web address] and use [place]'s universal password to access. If you do not have the password, please contact the hospital operator. 3. Locate the [facility] provider you are looking for under [facility] and page to a number that you can be directly reached. 4. If you still have difficulty reaching the provider, please page the [facility] (Director on Call) for the Hospitalists listed on amion for assistance.  04/22/2020, 10:51 AM

## 2020-04-23 DIAGNOSIS — I10 Essential (primary) hypertension: Secondary | ICD-10-CM | POA: Diagnosis not present

## 2020-04-23 DIAGNOSIS — J9601 Acute respiratory failure with hypoxia: Secondary | ICD-10-CM | POA: Diagnosis not present

## 2020-04-23 DIAGNOSIS — U071 COVID-19: Secondary | ICD-10-CM | POA: Diagnosis not present

## 2020-04-23 DIAGNOSIS — R7989 Other specified abnormal findings of blood chemistry: Secondary | ICD-10-CM | POA: Diagnosis not present

## 2020-04-23 LAB — CBC
HCT: 36.8 % (ref 36.0–46.0)
Hemoglobin: 12.6 g/dL (ref 12.0–15.0)
MCH: 29.4 pg (ref 26.0–34.0)
MCHC: 34.2 g/dL (ref 30.0–36.0)
MCV: 85.8 fL (ref 80.0–100.0)
Platelets: 73 10*3/uL — ABNORMAL LOW (ref 150–400)
RBC: 4.29 MIL/uL (ref 3.87–5.11)
RDW: 15.9 % — ABNORMAL HIGH (ref 11.5–15.5)
WBC: 2.9 10*3/uL — ABNORMAL LOW (ref 4.0–10.5)
nRBC: 0 % (ref 0.0–0.2)

## 2020-04-23 LAB — COMPREHENSIVE METABOLIC PANEL
ALT: 103 U/L — ABNORMAL HIGH (ref 0–44)
AST: 81 U/L — ABNORMAL HIGH (ref 15–41)
Albumin: 2.4 g/dL — ABNORMAL LOW (ref 3.5–5.0)
Alkaline Phosphatase: 206 U/L — ABNORMAL HIGH (ref 38–126)
Anion gap: 4 — ABNORMAL LOW (ref 5–15)
BUN: 34 mg/dL — ABNORMAL HIGH (ref 8–23)
CO2: 26 mmol/L (ref 22–32)
Calcium: 8 mg/dL — ABNORMAL LOW (ref 8.9–10.3)
Chloride: 104 mmol/L (ref 98–111)
Creatinine, Ser: 0.82 mg/dL (ref 0.44–1.00)
GFR, Estimated: >60 mL/min (ref >60)
Glucose, Bld: 133 mg/dL — ABNORMAL HIGH (ref 70–99)
Potassium: 4.8 mmol/L (ref 3.5–5.1)
Sodium: 134 mmol/L — ABNORMAL LOW (ref 135–145)
Total Bilirubin: 1.8 mg/dL — ABNORMAL HIGH (ref 0.3–1.2)
Total Protein: 5.2 g/dL — ABNORMAL LOW (ref 6.5–8.1)

## 2020-04-23 LAB — GLUCOSE, CAPILLARY
Glucose-Capillary: 103 mg/dL — ABNORMAL HIGH (ref 70–99)
Glucose-Capillary: 107 mg/dL — ABNORMAL HIGH (ref 70–99)
Glucose-Capillary: 110 mg/dL — ABNORMAL HIGH (ref 70–99)
Glucose-Capillary: 126 mg/dL — ABNORMAL HIGH (ref 70–99)
Glucose-Capillary: 141 mg/dL — ABNORMAL HIGH (ref 70–99)

## 2020-04-23 LAB — D-DIMER, QUANTITATIVE: D-Dimer, Quant: 9.76 ug/mL-FEU — ABNORMAL HIGH (ref 0.00–0.50)

## 2020-04-23 NOTE — Progress Notes (Signed)
Initial Nutrition Assessment  DOCUMENTATION CODES:   Morbid obesity  INTERVENTION:  Continue Ensure Enlive po BID, each supplement provides 350 kcal and 20 grams of protein  Continue ProSource Plus 30 ml po BID, each supplement provides 100 kcal and 15 grams of protein   NUTRITION DIAGNOSIS:   Increased nutrient needs related to catabolic illness (pneumonia secondary to COVID-19 virus infection) as evidenced by estimated needs. -ongoing  GOAL:   Patient will meet greater than or equal to 90% of their needs -progressing  MONITOR:   PO intake, Weight trends, Supplement acceptance, I & O's, Labs  REASON FOR ASSESSMENT:   LOS    ASSESSMENT:   RD working remotely.  69 year old female with history significant of HTN, hypothyroidism, anxiety, and morbid obesity who was recently seen in ED on 11/8 after 5 days of COVID-19 symptoms with positive test at a local drug store. Patient was able to ambulate, maintaining 98% and above on room air without significant shortness of breath and was discharged with plans to receive outpatient monoclonal antibody infusion. Patient received infusion on 11/9 and returned to ED on 11/13 with 3 days of worsening shortness of breath, ongoing fevers, frequent diarrhea, abdominal cramping and nausea.  11/13-admit  Patient discussed in rounds, oxygen requirements continue to improve, now on 6 L. Desaturates with activity, but recovers, PT evaluation pending.   Appetite appears to be improving, eating 100% of all meals since last assessment. (11/18-11/24) Per medication review, she has been consuming Ensure and ProSource supplements, noted refusal of ProSource Plus supplement today.   Per flowsheet, pt has not had BM x 5 days, consider scheduled bowel regimen.   Admit wt 122.5 kg        Current wt 123.5 kg I/Os: -2282.6 ml since admit UOP: 1800 ml x 24 hrs  Medications reviewed and include: SSI, Methylprednisolone  Labs: CBGs 126,107,103,120, Na  134 (L), WBC 2.9 (L)  Diet Order:   Diet Order            Diet Heart Room service appropriate? Yes; Fluid consistency: Thin  Diet effective now                 EDUCATION NEEDS:   Not appropriate for education at this time  Skin:  Skin Assessment: Reviewed RN Assessment  Last BM:  11/19  Height:   Ht Readings from Last 1 Encounters:  04/14/20 5\' 5"  (1.651 m)    Weight:   Wt Readings from Last 1 Encounters:  04/20/20 123.5 kg    Ideal Body Weight:  56.8 kg  BMI:  Body mass index is 45.31 kg/m.  Estimated Nutritional Needs:   Kcal:  04/22/20  Protein:  126-142  Fluid:  >/= 2.3 L/day   [id number], RD, LDN Clinical Nutrition After Hours/Weekend Pager # in Amion

## 2020-04-23 NOTE — Progress Notes (Signed)
PROGRESS NOTE  Angelica Wong ZYS:[id number] DOB: 1951/01/11 DOA: 04/12/2020 PCP: Lenoria Chime, FNP  Brief History: 69 y.o.femalewith medical history significant ofhypertension, hypothyroidism, morbid obesity, was recently diagnosed with COVID-19on 11/3 /21. She had a positive test at a local drugstore. She was seen on11/8/21in the emergency room and at that time she was not hypoxic. She received a monoclonal antibody infusion the following day on 11/9. She reports over the past 3 days she has had worsening shortness of breath. She is also had frequent diarrhea, abdominal cramping and nausea. She has not had any vomiting. She continues to have fevers. Her husband also has COVID-19, but overall is doing better than she is. She is unvaccinated.The patient was admitted and started on IV solumedrol and remdesivir.  Assessment/Plan: Acute respiratory failure with hypoxia secondary COVID-19 pneumonia -Patient was requiring oxygen up to 15LHFNC>>12L>>10L>>4L -Finished 5 daysremdesivir 11/17 -d/c baricitinib if leukopenia/thrombocytopenia worsen -Changed steroids back to IV -Ferritin 1272>>729>>588>>486>>460>>440>>468 -CRP 5.9>>7.4>>4.5>>2.5>>1.8>>5.2>>3.4 -D-dimer 1.71>>1.87>>1.41>>1.65>>2.96>>3.93>>3.96 -CTA chest--no PE; Diffuse ground-glass airspace disease in the upper lobes and peripheral lower lobes. No pneumothorax. No pleural fluid -venous duplex legs--no DVT -PCT 0.48 -Finished5 days levofloxacin(11/18)which was started secondary to elevated PCT -OOB each shift -incentive spirometry -prone positioning as much as possible  Thrombocytopenia -Appears to be chronic, worsened by acute infection -Patient has been evaluated at hematology clinic in [place] -Check B12--1650 -Check folic acid--11.8 -finished 10 days baricitinib 11/24  Hypothyroidism -Continue Synthroid  Hyperglycemia -Due to steroids -Continue NovoLog sliding  scale -Continue reduced dose Lantus -04/13/2020 hemoglobin A1c 5.6 -CBGs controlled  Transaminasemia -Secondary to COVID-19 infection -Overall trending down/stable  Essential hypertension -Continue metoprolol succinate  Depression/anxiety -Continue Cymbalta  Morbid Obesity -BMI 44.90 -lifestyle modification      Subjective: Patient denies fevers, chills, headache, chest pain, dyspnea, nausea, vomiting, diarrhea, abdominal pain, dysuria, hematuria, hematochezia, and melena.   Objective: Vitals:   04/23/20 1400 04/23/20 1500 04/23/20 1600 04/23/20 1606  BP: (!) 111/58 137/65 (!) 153/68   Pulse: 72 70 70 71  Resp:  (!) 21 19 19   Temp:    98.6 F (37 C)  TempSrc:    Oral  SpO2: 99% 99% 100% 94%  Weight:      Height:        Intake/Output Summary (Last 24 hours) at 04/23/2020 1746 Last data filed at 04/23/2020 1300 Gross per 24 hour  Intake 480 ml  Output 1450 ml  Net -970 ml   Weight change:  Exam:   General:  Pt is alert, follows commands appropriately, not in acute distress  HEENT: No icterus, No thrush, No neck mass, [place]/AT  Cardiovascular: RRR, S1/S2, no rubs, no gallops  Respiratory: bibasilar crackles. No wheeze  Abdomen: Soft/+BS, non tender, non distended, no guarding  Extremities: Nonpitting edema, No lymphangitis, No petechiae, No rashes, no synovitis   Data Reviewed: I have personally reviewed following labs and imaging studies Basic Metabolic Panel: Recent Labs  Lab 04/18/20 0441 04/20/20 0511 04/21/20 0409 04/22/20 0554 04/23/20 0459  NA 135 135 135 134* 134*  K 4.3 4.5 4.4 4.7 4.8  CL 103 103 104 103 104  CO2 24 26 26 27 26   GLUCOSE 148* 150* 132* 148* 133*  BUN 24* 28* 29* 32* 34*  CREATININE 0.90 0.88 0.87 0.83 0.82  CALCIUM 8.2* 8.2* 8.0* 8.0* 8.0*  MG 2.0 2.3  --   --   --    Liver Function Tests: Recent Labs  Lab 04/18/20  [id number] 04/20/20 0511 04/21/20 0409 04/22/20 0554 04/23/20 0459  AST 86* 87* 81* 75* 81*    ALT 69* 82* 91* 96* 103*  ALKPHOS 269* 251* 230* 225* 206*  BILITOT 2.0* 1.5* 1.6* 1.6* 1.8*  PROT 5.7* 5.3* 5.2* 5.2* 5.2*  ALBUMIN 2.5* 2.3* 2.3* 2.3* 2.4*   No results for input(s): LIPASE, AMYLASE in the last 168 hours. No results for input(s): AMMONIA in the last 168 hours. Coagulation Profile: No results for input(s): INR, PROTIME in the last 168 hours. CBC: Recent Labs  Lab 04/17/20 0735 04/17/20 0735 04/18/20 0441 04/20/20 0511 04/21/20 0409 04/22/20 0554 04/23/20 0459  WBC 1.8*   < > 2.6* 3.7* 2.9* 2.8* 2.9*  NEUTROABS 1.4*  --   --   --   --   --   --   HGB 12.1   < > 13.1 12.3 12.3 12.5 12.6  HCT 36.6   < > 39.5 36.6 36.8 37.3 36.8  MCV 87.4   < > 85.9 86.5 88.0 86.7 85.8  PLT 43*   < > 47* 69* 65* 73* 73*   < > = values in this interval not displayed.   Cardiac Enzymes: No results for input(s): CKTOTAL, CKMB, CKMBINDEX, TROPONINI in the last 168 hours. BNP: Invalid input(s): POCBNP CBG: Recent Labs  Lab 04/22/20 2012 04/23/20 0025 04/23/20 0827 04/23/20 1239 04/23/20 1605  GLUCAP 120* 103* 107* 126* 110*   HbA1C: No results for input(s): HGBA1C in the last 72 hours. Urine analysis:    Component Value Date/Time   COLORURINE YELLOW 08/05/2011 1107   APPEARANCEUR CLEAR 08/05/2011 1107   LABSPEC 1.025 08/05/2011 1107   PHURINE 6.0 08/05/2011 1107   GLUCOSEU NEGATIVE 08/05/2011 1107   HGBUR NEGATIVE 08/05/2011 1107   BILIRUBINUR NEGATIVE 08/05/2011 1107   KETONESUR NEGATIVE 08/05/2011 1107   PROTEINUR NEGATIVE 08/05/2011 1107   UROBILINOGEN 0.2 08/05/2011 1107   NITRITE NEGATIVE 08/05/2011 1107   LEUKOCYTESUR NEGATIVE 08/05/2011 1107   Sepsis Labs: @LABRCNTIP (procalcitonin:4,lacticidven:4) ) Recent Results (from the past 240 hour(s))  MRSA PCR Screening     Status: None   Collection Time: 04/13/20 11:50 PM   Specimen: Nasopharyngeal  Result Value Ref Range Status   MRSA by PCR NEGATIVE NEGATIVE Final    Comment:        The GeneXpert MRSA  Assay (FDA approved for NASAL specimens only), is one component of a comprehensive MRSA colonization surveillance program. It is not intended to diagnose MRSA infection nor to guide or monitor treatment for MRSA infections. Performed at [facility], [address] [place], [place] [id number]      Scheduled Meds: . (feeding supplement) PROSource Plus  30 mL Oral BID BM  . albuterol  2 puff Inhalation TID  . Chlorhexidine Gluconate Cloth  6 each Topical Daily  . DULoxetine  60 mg Oral Daily  . feeding supplement  237 mL Oral BID BM  . insulin aspart  0-20 Units Subcutaneous TID WC  . insulin aspart  0-5 Units Subcutaneous QHS  . insulin aspart  2 Units Subcutaneous TID WC  . levothyroxine  112 mcg Oral Q0600  . methylPREDNISolone (SOLU-MEDROL) injection  125 mg Intravenous Q12H  . metoprolol succinate  25 mg Oral Daily  . montelukast  10 mg Oral QHS  . sodium chloride flush  10-40 mL Intracatheter Q12H   Continuous Infusions:  Procedures/Studies: CT ANGIO CHEST PE W OR WO CONTRAST  Result Date: 04/16/2020 CLINICAL DATA:  Concern for pulmonary embolism. Positive D-dimer. Positive  COVID-19. EXAM: CT ANGIOGRAPHY CHEST WITH CONTRAST TECHNIQUE: Multidetector CT imaging of the chest was performed using the standard protocol during bolus administration of intravenous contrast. Multiplanar CT image reconstructions and MIPs were obtained to evaluate the vascular anatomy. CONTRAST:  OMNIPAQUE IOHEXOL 350 MG/ML SOLN COMPARISON:  None FINDINGS: Cardiovascular: No filling defects within the pulmonary arteries to suggest acute pulmonary embolism. No significant vascular findings. Normal heart size. No pericardial effusion. Mediastinum/Nodes: No axillary or supraclavicular adenopathy. No mediastinal or hilar adenopathy. No pericardial fluid. Esophagus normal. Lungs/Pleura: Diffuse ground-glass airspace disease in the upper lobes and peripheral lower lobes. No pneumothorax. No pleural fluid  Upper Abdomen: Limited view of the liver, kidneys, pancreas are unremarkable. Normal adrenal glands. Musculoskeletal: No aggressive osseous lesion Review of the MIP images confirms the above findings. IMPRESSION: 1. No evidence acute pulmonary embolism. 2. Diffuse bilateral ground-glass airspace disease consistent with COVID pneumonia. Electronically Signed   By: Genevive Bi M.D.   On: 04/16/2020 17:02   US Venous Img Lower Bilateral (DVT)  Result Date: 04/21/2020 CLINICAL DATA:  69 year old female with elevated D-dimer. EXAM: BILATERAL LOWER EXTREMITY VENOUS DOPPLER ULTRASOUND TECHNIQUE: Gray-scale sonography with graded compression, as well as color Doppler and duplex ultrasound were performed to evaluate the lower extremity deep venous systems from the level of the common femoral vein and including the common femoral, femoral, profunda femoral, popliteal and calf veins including the posterior tibial, peroneal and gastrocnemius veins when visible. The superficial great saphenous vein was also interrogated. Spectral Doppler was utilized to evaluate flow at rest and with distal augmentation maneuvers in the common femoral, femoral and popliteal veins. COMPARISON:  None. FINDINGS: RIGHT LOWER EXTREMITY Common Femoral Vein: No evidence of thrombus. Normal compressibility, respiratory phasicity and response to augmentation. Saphenofemoral Junction: No evidence of thrombus. Normal compressibility and flow on color Doppler imaging. Profunda Femoral Vein: No evidence of thrombus. Normal compressibility and flow on color Doppler imaging. Femoral Vein: No evidence of thrombus. Normal compressibility, respiratory phasicity and response to augmentation. Popliteal Vein: No evidence of thrombus. Normal compressibility, respiratory phasicity and response to augmentation. Calf Veins: No evidence of thrombus. Normal compressibility and flow on color Doppler imaging. Superficial Great Saphenous Vein: No evidence of  thrombus. Normal compressibility. Other Findings:  None. LEFT LOWER EXTREMITY Common Femoral Vein: No evidence of thrombus. Normal compressibility, respiratory phasicity and response to augmentation. Saphenofemoral Junction: No evidence of thrombus. Normal compressibility and flow on color Doppler imaging. Profunda Femoral Vein: No evidence of thrombus. Normal compressibility and flow on color Doppler imaging. Femoral Vein: No evidence of thrombus. Normal compressibility, respiratory phasicity and response to augmentation. Popliteal Vein: No evidence of thrombus. Normal compressibility, respiratory phasicity and response to augmentation. Calf Veins: No evidence of thrombus. Normal compressibility and flow on color Doppler imaging. Superficial Great Saphenous Vein: No evidence of thrombus. Normal compressibility. Other Findings:  None. IMPRESSION: No evidence of bilateral lower extremity deep venous thrombosis. Marliss Coots, MD [facility] Electronically Signed   By: Marliss Coots MD   On: 04/21/2020 10:16   DG Chest Portable 1 View  Result Date: 04/19/2020 CLINICAL DATA:  PICC line confirmation.  Shortness of breath. EXAM: PORTABLE CHEST 1 VIEW COMPARISON:  April 12, 2020 FINDINGS: A new right PICC line terminates in the central SVC just above the caval atrial junction. Bilateral primarily peripheral pulmonary infiltrates are more pronounced in the interval. The cardiomediastinal silhouette is stable. No pneumothorax. IMPRESSION: 1. The new right PICC line is in good  position. 2. Peripheral bilateral pulmonary infiltrates are more pronounced in the interval consistent with COVID-19 pneumonia given the patient's history of a COVID-19 positive status. Electronically Signed   By: Gerome Sam III M.D   On: 04/19/2020 17:07   DG Chest Port 1 View  Result Date: 04/12/2020 CLINICAL DATA:  COVID positive. EXAM: PORTABLE CHEST 1 VIEW COMPARISON:   04/07/2020 FINDINGS: 0649 hours. Lungs are hyperexpanded. Interval progression of patchy peripheral ground-glass airspace opacity compatible with multifocal pneumonia. The cardio pericardial silhouette is enlarged. The visualized bony structures of the thorax show no acute abnormality. Telemetry leads overlie the chest. IMPRESSION: Progression of peripherally predominant patchy ground-glass attenuation consistent with multifocal pneumonia. Electronically Signed   By: Kennith Center M.D.   On: 04/12/2020 07:26   DG Chest Portable 1 View  Result Date: 04/07/2020 CLINICAL DATA:  Cough shortness of breath COVID positive EXAM: PORTABLE CHEST 1 VIEW COMPARISON:  None. FINDINGS: The heart size and mediastinal contours are within normal limits. Hazy patchy airspace opacity seen within the right mid lung and bilateral lung bases. Aortic calcifications are seen. The visualized skeletal structures are unremarkable. IMPRESSION: Patchy airspace opacity seen within both lungs which be due to infectious etiology. Electronically Signed   By: Jonna Clark M.D.   On: 04/07/2020 18:46   [place] EKG SITE RITE  Result Date: 04/19/2020 If Site Rite image not attached, placement could not be confirmed due to current cardiac rhythm.  US Abdomen Limited RUQ (LIVER/GB)  Result Date: 04/12/2020 CLINICAL DATA:  Elevated LFTs. EXAM: ULTRASOUND ABDOMEN LIMITED RIGHT UPPER QUADRANT COMPARISON:  None. FINDINGS: Gallbladder: No gallstones or gallbladder wall thickening. No pericholecystic fluid. The sonographer reports no sonographic Murphy's sign. Common bile duct: Diameter: 11 mm Liver: Liver parenchyma is echogenic and heterogeneous. Portal vein is patent on color Doppler imaging with normal direction of blood flow towards the liver. Other: None. IMPRESSION: Extrahepatic biliary duct dilatation. MR I/MRCP could be used to further evaluate as clinically warranted. Heterogeneous echogenic liver, potentially related to steatosis.  Electronically Signed   By: Kennith Center M.D.   On: 04/12/2020 11:27    Catarina Hartshorn, DO  [facility]  If 7PM-7AM, please contact night-coverage [email or web address] Password [email or web address] 04/23/2020, 5:46 PM   LOS: 11 days

## 2020-04-23 NOTE — Care Management Important Message (Signed)
Important Message  Patient Details  Name: Angelica Wong MRN: [id number] Date of Birth: [birth year]   Medicare Important Message Given:  Yes - Important Message mailed due to current National Emergency     Corey Harold 04/23/2020, 4:30 PM

## 2020-04-23 NOTE — Progress Notes (Signed)
Palliative: Thorough chart review completed.  Angelica Wong is seen through the glass door of her ICU room.  She remained stable with Covid pneumonia.  She does desaturate with activity, but recovers.  She has been weaned down to 6 L nasal cannula.  Conference with attending, bedside nursing staff, transition of care team related to patient condition, needs, goals of care.  Plan:   Continue to treat the treatable.  Continue full scope/full code.  Angelica Wong is open to any and all treatment to prolong her life. She is open to skilled nursing facility for short-term rehab if needed.  But it seems that she may be strong enough to return to her own home.  No charge  Lillia Carmel, NP Palliative Medicine Team Team Phone # [phone] Greater than 50% of this time was spent counseling and coordinating care related to the above assessment and plan.

## 2020-04-24 DIAGNOSIS — J9601 Acute respiratory failure with hypoxia: Secondary | ICD-10-CM | POA: Diagnosis not present

## 2020-04-24 DIAGNOSIS — U071 COVID-19: Secondary | ICD-10-CM | POA: Diagnosis not present

## 2020-04-24 DIAGNOSIS — I10 Essential (primary) hypertension: Secondary | ICD-10-CM | POA: Diagnosis not present

## 2020-04-24 DIAGNOSIS — R7989 Other specified abnormal findings of blood chemistry: Secondary | ICD-10-CM | POA: Diagnosis not present

## 2020-04-24 LAB — COMPREHENSIVE METABOLIC PANEL
ALT: 112 U/L — ABNORMAL HIGH (ref 0–44)
AST: 87 U/L — ABNORMAL HIGH (ref 15–41)
Albumin: 2.2 g/dL — ABNORMAL LOW (ref 3.5–5.0)
Alkaline Phosphatase: 193 U/L — ABNORMAL HIGH (ref 38–126)
Anion gap: 5 (ref 5–15)
BUN: 33 mg/dL — ABNORMAL HIGH (ref 8–23)
CO2: 27 mmol/L (ref 22–32)
Calcium: 8 mg/dL — ABNORMAL LOW (ref 8.9–10.3)
Chloride: 103 mmol/L (ref 98–111)
Creatinine, Ser: 0.79 mg/dL (ref 0.44–1.00)
GFR, Estimated: >60 mL/min (ref >60)
Glucose, Bld: 135 mg/dL — ABNORMAL HIGH (ref 70–99)
Potassium: 4.8 mmol/L (ref 3.5–5.1)
Sodium: 135 mmol/L (ref 135–145)
Total Bilirubin: 1.8 mg/dL — ABNORMAL HIGH (ref 0.3–1.2)
Total Protein: 4.8 g/dL — ABNORMAL LOW (ref 6.5–8.1)

## 2020-04-24 LAB — CBC
HCT: 35.9 % — ABNORMAL LOW (ref 36.0–46.0)
Hemoglobin: 12.1 g/dL (ref 12.0–15.0)
MCH: 29.2 pg (ref 26.0–34.0)
MCHC: 33.7 g/dL (ref 30.0–36.0)
MCV: 86.5 fL (ref 80.0–100.0)
Platelets: 71 10*3/uL — ABNORMAL LOW (ref 150–400)
RBC: 4.15 MIL/uL (ref 3.87–5.11)
RDW: 15.9 % — ABNORMAL HIGH (ref 11.5–15.5)
WBC: 3.7 10*3/uL — ABNORMAL LOW (ref 4.0–10.5)
nRBC: 0 % (ref 0.0–0.2)

## 2020-04-24 LAB — FERRITIN: Ferritin: 503 ng/mL — ABNORMAL HIGH (ref 11–307)

## 2020-04-24 LAB — C-REACTIVE PROTEIN: CRP: 0.6 mg/dL (ref <1.0)

## 2020-04-24 LAB — GLUCOSE, CAPILLARY
Glucose-Capillary: 152 mg/dL — ABNORMAL HIGH (ref 70–99)
Glucose-Capillary: 122 mg/dL — ABNORMAL HIGH (ref 70–99)

## 2020-04-24 NOTE — TOC Transition Note (Signed)
Transition of Care [facility]) - CM/SW Discharge Note   Patient Details  Name: Angelica Wong MRN: [id number] Date of Birth: 11-Sep-1950  [facility] of [facility]) CM/SW Contact:  Leitha Bleak, RN Phone Number: 04/24/2020, 1:27 PM   Clinical Narrative:   Patient admitted with pnuemonia, Discharging home today with the need of Home Oxygen. [employer] has on call drivers for the holiday. Oxygen ordered,  they will deliver and do home set up later today.      Barriers to Discharge: Barriers Resolved   Patient Goals and CMS Choice Patient states their goals for this hospitalization and ongoing recovery are:: to go home. CMS Medicare.gov Compare Post Acute Care list provided to:: Patient      Discharge Plan and Services             DME Arranged: Oxygen DME Agency: Patsy Lager Date DME Agency Contacted: 04/24/20 Time DME Agency Contacted: [phone] Representative spoke with at DME Agency: On call center  at [facility] [phone]

## 2020-04-24 NOTE — Progress Notes (Signed)
PICC line removed with no complications. Pressure dressing applied.

## 2020-04-24 NOTE — Progress Notes (Signed)
SATURATION QUALIFICATIONS: (This note is used to comply with regulatory documentation for home oxygen)  Patient Saturations on Room Air at Rest = 81%  Patient Saturations on Room Air while Ambulating = not done  Patient Saturations on 4 Liters of oxygen while Ambulating = 94%  Please briefly explain why patient needs home oxygen: To maintain 02 sat at 90% or above during ambulation.  Catarina Hartshorn, DO

## 2020-04-24 NOTE — Discharge Summary (Signed)
Physician Discharge Summary  Angelica Wong BJY:[id number] DOB: 04/25/1951 DOA: 04/12/2020  PCP: Lenoria ChimeWhite, Valerie A, FNP  Admit date: 04/12/2020 Discharge date: 04/24/2020  Admitted From: Home Disposition:  Home   Recommendations for Outpatient Follow-up:  1. Follow up with PCP in 1-2 weeks 2. Please obtain BMP/CBC in one week   Home Health: YES Equipment/Devices: 4L  Discharge Condition: Stable CODE STATUS:FULL Diet recommendation: Heart Healthy   Brief/Interim Summary: 69 y.o.femalewith medical history significant ofhypertension, hypothyroidism, morbid obesity, was recently diagnosed with COVID-19on 11/3 /21. She had a positive test at a local drugstore. She was seen on11/8/21in the emergency room and at that time she was not hypoxic. She received a monoclonal antibody infusion the following day on 11/9. She reports over the past 3 days she has had worsening shortness of breath. She is also had frequent diarrhea, abdominal cramping and nausea. She has not had any vomiting. She continues to have fevers. Her husband also has COVID-19, but overall is doing better than she is. She is unvaccinated.The patient was admitted and started on IV solumedrol and remdesivir.  Baricitinib was added.  The patient finished 10 days of baricitinib and steroids.  She finished 5 days remdesivir.  With incentive spirometry and increasing activity, her oxygen demand slowly improved.  Discharge Diagnoses:   Acute respiratory failure with hypoxia secondary COVID-19 pneumonia -Patient was requiring oxygen up to 15LHFNC>>12L>>10L>>4L -Finished 5 daysremdesivir 11/17 -d/c baricitinib if leukopenia/thrombocytopenia worsen -Changedsteroids back to IV--finished 10 days -Ferritin 1272>>729>>588>>486>>460>>440>>468>>503 -CRP 5.9>>7.4>>4.5>>2.5>>1.8>>5.2>>3.4>>0.6 -D-dimer 1.71>>1.87>>1.41>>1.65>>2.96>>3.93>>3.96 -CTA chest--no PE; Diffuse ground-glass airspace disease in the  upper lobes and peripheral lower lobes. No pneumothorax. No pleural fluid -venous duplex legs--no DVT -PCT 0.48 -Finished5 days levofloxacin(11/18)which was started secondary to elevated PCT -OOB each shift -incentive spirometry -prone positioning as much as possible  Thrombocytopenia -Appears to be chronic, worsened by acute infection -[place] -Check B12--1650 -Check folic acid--11.8 -finished 10 days baricitinib 11/24 -overall stable/improving  Hypothyroidism -Continue Synthroid  Hyperglycemia -Due to steroids -Continue NovoLog sliding scale -Continue reduced dose Lantus -04/13/2020 hemoglobin A1c 5.6 -CBGs controlled  Transaminasemia -Secondary to COVID-19 infection -Overall trending down/stable  Essential hypertension -Continue metoprolol succinate  Depression/anxiety -Continue Cymbalta  Morbid Obesity -BMI 44.90 -lifestyle modification  Urine retention -Foley placed 11/16 -foley discontinued and pt was able to urinate spontaneously   Discharge Instructions   Allergies as of 04/24/2020      Reactions   Wellbutrin [bupropion Hcl] Other (See Comments)   Uncontrollable Crying      Medication List    STOP taking these medications   predniSONE 20 MG tablet Commonly known as: DELTASONE     TAKE these medications   albuterol 108 (90 Base) MCG/ACT inhaler Commonly known as: VENTOLIN HFA Inhale 1 puff into the lungs every 6 (six) hours as needed for wheezing or shortness of breath.   ascorbic acid 1000 MG tablet Commonly known as: VITAMIN C Take by mouth.   cyclobenzaprine 5 MG tablet Commonly known as: FLEXERIL Take 5 mg by mouth 2 (two) times daily.   DULoxetine 60 MG capsule Commonly known as: CYMBALTA Take 60 mg by mouth daily.   folic acid 1 MG tablet Commonly known as: FOLVITE Take 1 mg by mouth daily.   Levothyroxine Sodium 112 MCG Caps Take 112 mcg by mouth daily.     LORazepam 0.5 MG tablet Commonly known as: ATIVAN Take 0.5 mg by mouth 2 (two) times daily. 2 tablets at bedtime   metoprolol succinate  25 MG 24 hr tablet Commonly known as: TOPROL-XL Take 25 mg by mouth daily.   montelukast 10 MG tablet Commonly known as: SINGULAIR Take 10 mg by mouth daily.   multivitamin tablet Take 1 tablet by mouth daily.   simethicone 80 MG chewable tablet Commonly known as: Gas-X Chew 1 tablet (80 mg total) by mouth every 6 (six) hours as needed for flatulence.   traMADol 50 MG tablet Commonly known as: ULTRAM Take by mouth every 12 (twelve) hours as needed.   Vitamin D (Ergocalciferol) 1.25 MG (50000 UNIT) Caps capsule Commonly known as: DRISDOL Take 50,000 Units by mouth every 7 (seven) days. Fridays            Durable Medical Equipment  (From admission, onward)         Start     Ordered   04/24/20 1300  For home use only DME oxygen  Once       Question Answer Comment  Length of Need 6 Months   Mode or (Route) Nasal cannula   Liters per Minute 4   Oxygen conserving device Yes   Oxygen delivery system Gas      04/24/20 1259          Allergies  Allergen Reactions  . Wellbutrin [Bupropion Hcl] Other (See Comments)    Uncontrollable Crying    Consultations:  none   Procedures/Studies: CT ANGIO CHEST PE W OR WO CONTRAST  Result Date: 04/16/2020 CLINICAL DATA:  Concern for pulmonary embolism. Positive D-dimer. Positive COVID-19. EXAM: CT ANGIOGRAPHY CHEST WITH CONTRAST TECHNIQUE: Multidetector CT imaging of the chest was performed using the standard protocol during bolus administration of intravenous contrast. Multiplanar CT image reconstructions and MIPs were obtained to evaluate the vascular anatomy. CONTRAST:  OMNIPAQUE IOHEXOL 350 MG/ML SOLN COMPARISON:  None FINDINGS: Cardiovascular: No filling defects within the pulmonary arteries to suggest acute pulmonary embolism. No significant vascular findings. Normal heart  size. No pericardial effusion. Mediastinum/Nodes: No axillary or supraclavicular adenopathy. No mediastinal or hilar adenopathy. No pericardial fluid. Esophagus normal. Lungs/Pleura: Diffuse ground-glass airspace disease in the upper lobes and peripheral lower lobes. No pneumothorax. No pleural fluid Upper Abdomen: Limited view of the liver, kidneys, pancreas are unremarkable. Normal adrenal glands. Musculoskeletal: No aggressive osseous lesion Review of the MIP images confirms the above findings. IMPRESSION: 1. No evidence acute pulmonary embolism. 2. Diffuse bilateral ground-glass airspace disease consistent with COVID pneumonia. Electronically Signed   By: Genevive Bi M.D.   On: 04/16/2020 17:02   US Venous Img Lower Bilateral (DVT)  Result Date: 04/21/2020 CLINICAL DATA:  69 year old female with elevated D-dimer. EXAM: BILATERAL LOWER EXTREMITY VENOUS DOPPLER ULTRASOUND TECHNIQUE: Gray-scale sonography with graded compression, as well as color Doppler and duplex ultrasound were performed to evaluate the lower extremity deep venous systems from the level of the common femoral vein and including the common femoral, femoral, profunda femoral, popliteal and calf veins including the posterior tibial, peroneal and gastrocnemius veins when visible. The superficial great saphenous vein was also interrogated. Spectral Doppler was utilized to evaluate flow at rest and with distal augmentation maneuvers in the common femoral, femoral and popliteal veins. COMPARISON:  None. FINDINGS: RIGHT LOWER EXTREMITY Common Femoral Vein: No evidence of thrombus. Normal compressibility, respiratory phasicity and response to augmentation. Saphenofemoral Junction: No evidence of thrombus. Normal compressibility and flow on color Doppler imaging. Profunda Femoral Vein: No evidence of thrombus. Normal compressibility and flow on color Doppler imaging. Femoral Vein: No evidence of thrombus. Normal compressibility, respiratory  phasicity and response to augmentation. Popliteal Vein: No evidence of thrombus. Normal compressibility, respiratory phasicity and response to augmentation. Calf Veins: No evidence of thrombus. Normal compressibility and flow on color Doppler imaging. Superficial Great Saphenous Vein: No evidence of thrombus. Normal compressibility. Other Findings:  None. LEFT LOWER EXTREMITY Common Femoral Vein: No evidence of thrombus. Normal compressibility, respiratory phasicity and response to augmentation. Saphenofemoral Junction: No evidence of thrombus. Normal compressibility and flow on color Doppler imaging. Profunda Femoral Vein: No evidence of thrombus. Normal compressibility and flow on color Doppler imaging. Femoral Vein: No evidence of thrombus. Normal compressibility, respiratory phasicity and response to augmentation. Popliteal Vein: No evidence of thrombus. Normal compressibility, respiratory phasicity and response to augmentation. Calf Veins: No evidence of thrombus. Normal compressibility and flow on color Doppler imaging. Superficial Great Saphenous Vein: No evidence of thrombus. Normal compressibility. Other Findings:  None. IMPRESSION: No evidence of bilateral lower extremity deep venous thrombosis. Marliss Coots, MD [facility] Electronically Signed   By: Marliss Coots MD   On: 04/21/2020 10:16   DG Chest Portable 1 View  Result Date: 04/19/2020 CLINICAL DATA:  PICC line confirmation.  Shortness of breath. EXAM: PORTABLE CHEST 1 VIEW COMPARISON:  April 12, 2020 FINDINGS: A new right PICC line terminates in the central SVC just above the caval atrial junction. Bilateral primarily peripheral pulmonary infiltrates are more pronounced in the interval. The cardiomediastinal silhouette is stable. No pneumothorax. IMPRESSION: 1. The new right PICC line is in good position. 2. Peripheral bilateral pulmonary infiltrates are more pronounced in the  interval consistent with COVID-19 pneumonia given the patient's history of a COVID-19 positive status. Electronically Signed   By: Gerome Sam III M.D   On: 04/19/2020 17:07   DG Chest Port 1 View  Result Date: 04/12/2020 CLINICAL DATA:  COVID positive. EXAM: PORTABLE CHEST 1 VIEW COMPARISON:  04/07/2020 FINDINGS: 0649 hours. Lungs are hyperexpanded. Interval progression of patchy peripheral ground-glass airspace opacity compatible with multifocal pneumonia. The cardio pericardial silhouette is enlarged. The visualized bony structures of the thorax show no acute abnormality. Telemetry leads overlie the chest. IMPRESSION: Progression of peripherally predominant patchy ground-glass attenuation consistent with multifocal pneumonia. Electronically Signed   By: Kennith Center M.D.   On: 04/12/2020 07:26   DG Chest Portable 1 View  Result Date: 04/07/2020 CLINICAL DATA:  Cough shortness of breath COVID positive EXAM: PORTABLE CHEST 1 VIEW COMPARISON:  None. FINDINGS: The heart size and mediastinal contours are within normal limits. Hazy patchy airspace opacity seen within the right mid lung and bilateral lung bases. Aortic calcifications are seen. The visualized skeletal structures are unremarkable. IMPRESSION: Patchy airspace opacity seen within both lungs which be due to infectious etiology. Electronically Signed   By: Jonna Clark M.D.   On: 04/07/2020 18:46   [place] EKG SITE RITE  Result Date: 04/19/2020 If Site Rite image not attached, placement could not be confirmed due to current cardiac rhythm.  US Abdomen Limited RUQ (LIVER/GB)  Result Date: 04/12/2020 CLINICAL DATA:  Elevated LFTs. EXAM: ULTRASOUND ABDOMEN LIMITED RIGHT UPPER QUADRANT COMPARISON:  None. FINDINGS: Gallbladder: No gallstones or gallbladder wall thickening. No pericholecystic fluid. The sonographer reports no sonographic Murphy's sign. Common bile duct: Diameter: 11 mm Liver: Liver parenchyma is echogenic and heterogeneous.  Portal vein is patent on color Doppler imaging with normal direction of blood flow towards the liver. Other: None. IMPRESSION: Extrahepatic biliary duct dilatation. MR I/MRCP could be used to further evaluate as clinically warranted.  Heterogeneous echogenic liver, potentially related to steatosis. Electronically Signed   By: Kennith Center M.D.   On: 04/12/2020 11:27         Discharge Exam: Vitals:   04/24/20 0859 04/24/20 0939  BP:  (!) 121/96  Pulse:  81  Resp:  17  Temp:  97.8 F (36.6 C)  SpO2: 91% 92%   Vitals:   04/24/20 0248 04/24/20 0645 04/24/20 0859 04/24/20 0939  BP: (!) 144/84 99/62  (!) 121/96  Pulse: 68 74  81  Resp:    17  Temp: 98.7 F (37.1 C) 98.4 F (36.9 C)  97.8 F (36.6 C)  TempSrc: Oral Oral  Oral  SpO2: 93% 94% 91% 92%  Weight:      Height:        General: Pt is alert, awake, not in acute distress Cardiovascular: RRR, S1/S2 +, no rubs, no gallops Respiratory: bibasilar crackles. No wheeze Abdominal: Soft, NT, ND, bowel sounds + Extremities: nonpitting edema, no cyanosis   The results of significant diagnostics from this hospitalization (including imaging, microbiology, ancillary and laboratory) are listed below for reference.    Significant Diagnostic Studies: CT ANGIO CHEST PE W OR WO CONTRAST  Result Date: 04/16/2020 CLINICAL DATA:  Concern for pulmonary embolism. Positive D-dimer. Positive COVID-19. EXAM: CT ANGIOGRAPHY CHEST WITH CONTRAST TECHNIQUE: Multidetector CT imaging of the chest was performed using the standard protocol during bolus administration of intravenous contrast. Multiplanar CT image reconstructions and MIPs were obtained to evaluate the vascular anatomy. CONTRAST:  OMNIPAQUE IOHEXOL 350 MG/ML SOLN COMPARISON:  None FINDINGS: Cardiovascular: No filling defects within the pulmonary arteries to suggest acute pulmonary embolism. No significant vascular findings. Normal heart size. No pericardial effusion. Mediastinum/Nodes:  No axillary or supraclavicular adenopathy. No mediastinal or hilar adenopathy. No pericardial fluid. Esophagus normal. Lungs/Pleura: Diffuse ground-glass airspace disease in the upper lobes and peripheral lower lobes. No pneumothorax. No pleural fluid Upper Abdomen: Limited view of the liver, kidneys, pancreas are unremarkable. Normal adrenal glands. Musculoskeletal: No aggressive osseous lesion Review of the MIP images confirms the above findings. IMPRESSION: 1. No evidence acute pulmonary embolism. 2. Diffuse bilateral ground-glass airspace disease consistent with COVID pneumonia. Electronically Signed   By: Genevive Bi M.D.   On: 04/16/2020 17:02   US Venous Img Lower Bilateral (DVT)  Result Date: 04/21/2020 CLINICAL DATA:  69 year old female with elevated D-dimer. EXAM: BILATERAL LOWER EXTREMITY VENOUS DOPPLER ULTRASOUND TECHNIQUE: Gray-scale sonography with graded compression, as well as color Doppler and duplex ultrasound were performed to evaluate the lower extremity deep venous systems from the level of the common femoral vein and including the common femoral, femoral, profunda femoral, popliteal and calf veins including the posterior tibial, peroneal and gastrocnemius veins when visible. The superficial great saphenous vein was also interrogated. Spectral Doppler was utilized to evaluate flow at rest and with distal augmentation maneuvers in the common femoral, femoral and popliteal veins. COMPARISON:  None. FINDINGS: RIGHT LOWER EXTREMITY Common Femoral Vein: No evidence of thrombus. Normal compressibility, respiratory phasicity and response to augmentation. Saphenofemoral Junction: No evidence of thrombus. Normal compressibility and flow on color Doppler imaging. Profunda Femoral Vein: No evidence of thrombus. Normal compressibility and flow on color Doppler imaging. Femoral Vein: No evidence of thrombus. Normal compressibility, respiratory phasicity and response to augmentation. Popliteal Vein:  No evidence of thrombus. Normal compressibility, respiratory phasicity and response to augmentation. Calf Veins: No evidence of thrombus. Normal compressibility and flow on color Doppler imaging. Superficial Great Saphenous Vein: No evidence of  thrombus. Normal compressibility. Other Findings:  None. LEFT LOWER EXTREMITY Common Femoral Vein: No evidence of thrombus. Normal compressibility, respiratory phasicity and response to augmentation. Saphenofemoral Junction: No evidence of thrombus. Normal compressibility and flow on color Doppler imaging. Profunda Femoral Vein: No evidence of thrombus. Normal compressibility and flow on color Doppler imaging. Femoral Vein: No evidence of thrombus. Normal compressibility, respiratory phasicity and response to augmentation. Popliteal Vein: No evidence of thrombus. Normal compressibility, respiratory phasicity and response to augmentation. Calf Veins: No evidence of thrombus. Normal compressibility and flow on color Doppler imaging. Superficial Great Saphenous Vein: No evidence of thrombus. Normal compressibility. Other Findings:  None. IMPRESSION: No evidence of bilateral lower extremity deep venous thrombosis. Marliss Coots, MD [facility] Electronically Signed   By: Marliss Coots MD   On: 04/21/2020 10:16   DG Chest Portable 1 View  Result Date: 04/19/2020 CLINICAL DATA:  PICC line confirmation.  Shortness of breath. EXAM: PORTABLE CHEST 1 VIEW COMPARISON:  April 12, 2020 FINDINGS: A new right PICC line terminates in the central SVC just above the caval atrial junction. Bilateral primarily peripheral pulmonary infiltrates are more pronounced in the interval. The cardiomediastinal silhouette is stable. No pneumothorax. IMPRESSION: 1. The new right PICC line is in good position. 2. Peripheral bilateral pulmonary infiltrates are more pronounced in the interval consistent with COVID-19 pneumonia given the  patient's history of a COVID-19 positive status. Electronically Signed   By: Gerome Sam III M.D   On: 04/19/2020 17:07   DG Chest Port 1 View  Result Date: 04/12/2020 CLINICAL DATA:  COVID positive. EXAM: PORTABLE CHEST 1 VIEW COMPARISON:  04/07/2020 FINDINGS: 0649 hours. Lungs are hyperexpanded. Interval progression of patchy peripheral ground-glass airspace opacity compatible with multifocal pneumonia. The cardio pericardial silhouette is enlarged. The visualized bony structures of the thorax show no acute abnormality. Telemetry leads overlie the chest. IMPRESSION: Progression of peripherally predominant patchy ground-glass attenuation consistent with multifocal pneumonia. Electronically Signed   By: Kennith Center M.D.   On: 04/12/2020 07:26   DG Chest Portable 1 View  Result Date: 04/07/2020 CLINICAL DATA:  Cough shortness of breath COVID positive EXAM: PORTABLE CHEST 1 VIEW COMPARISON:  None. FINDINGS: The heart size and mediastinal contours are within normal limits. Hazy patchy airspace opacity seen within the right mid lung and bilateral lung bases. Aortic calcifications are seen. The visualized skeletal structures are unremarkable. IMPRESSION: Patchy airspace opacity seen within both lungs which be due to infectious etiology. Electronically Signed   By: Jonna Clark M.D.   On: 04/07/2020 18:46   [place] EKG SITE RITE  Result Date: 04/19/2020 If Site Rite image not attached, placement could not be confirmed due to current cardiac rhythm.  US Abdomen Limited RUQ (LIVER/GB)  Result Date: 04/12/2020 CLINICAL DATA:  Elevated LFTs. EXAM: ULTRASOUND ABDOMEN LIMITED RIGHT UPPER QUADRANT COMPARISON:  None. FINDINGS: Gallbladder: No gallstones or gallbladder wall thickening. No pericholecystic fluid. The sonographer reports no sonographic Murphy's sign. Common bile duct: Diameter: 11 mm Liver: Liver parenchyma is echogenic and heterogeneous. Portal vein is patent on color Doppler imaging with normal  direction of blood flow towards the liver. Other: None. IMPRESSION: Extrahepatic biliary duct dilatation. MR I/MRCP could be used to further evaluate as clinically warranted. Heterogeneous echogenic liver, potentially related to steatosis. Electronically Signed   By: Kennith Center M.D.   On: 04/12/2020 11:27     Microbiology: No results found for this or any previous visit (from the past 240 hour(s)).  Labs: Basic Metabolic Panel: Recent Labs  Lab 04/18/20 0441 04/18/20 0441 04/20/20 0511 04/20/20 0511 04/21/20 0409 04/21/20 0409 04/22/20 0554 04/22/20 0554 04/23/20 0459 04/24/20 0538  NA 135   < > 135  --  135  --  134*  --  134* 135  K 4.3   < > 4.5   < > 4.4   < > 4.7   < > 4.8 4.8  CL 103   < > 103  --  104  --  103  --  104 103  CO2 24   < > 26  --  26  --  27  --  26 27  GLUCOSE 148*   < > 150*  --  132*  --  148*  --  133* 135*  BUN 24*   < > 28*  --  29*  --  32*  --  34* 33*  CREATININE 0.90   < > 0.88  --  0.87  --  0.83  --  0.82 0.79  CALCIUM 8.2*   < > 8.2*  --  8.0*  --  8.0*  --  8.0* 8.0*  MG 2.0  --  2.3  --   --   --   --   --   --   --    < > = values in this interval not displayed.   Liver Function Tests: Recent Labs  Lab 04/20/20 0511 04/21/20 0409 04/22/20 0554 04/23/20 0459 04/24/20 0538  AST 87* 81* 75* 81* 87*  ALT 82* 91* 96* 103* 112*  ALKPHOS 251* 230* 225* 206* 193*  BILITOT 1.5* 1.6* 1.6* 1.8* 1.8*  PROT 5.3* 5.2* 5.2* 5.2* 4.8*  ALBUMIN 2.3* 2.3* 2.3* 2.4* 2.2*   No results for input(s): LIPASE, AMYLASE in the last 168 hours. No results for input(s): AMMONIA in the last 168 hours. CBC: Recent Labs  Lab 04/20/20 0511 04/21/20 0409 04/22/20 0554 04/23/20 0459 04/24/20 0538  WBC 3.7* 2.9* 2.8* 2.9* 3.7*  HGB 12.3 12.3 12.5 12.6 12.1  HCT 36.6 36.8 37.3 36.8 35.9*  MCV 86.5 88.0 86.7 85.8 86.5  PLT 69* 65* 73* 73* 71*   Cardiac Enzymes: No results for input(s): CKTOTAL, CKMB, CKMBINDEX, TROPONINI in the last 168  hours. BNP: Invalid input(s): POCBNP CBG: Recent Labs  Lab 04/23/20 1239 04/23/20 1605 04/23/20 2220 04/24/20 0736 04/24/20 1136  GLUCAP 126* 110* 141* 152* 122*    Time coordinating discharge:  36 minutes  Signed:  Catarina Hartshorn, DO [facility] Pager: [phone] 04/24/2020, 1:00 PM

## 2021-12-04 IMAGING — US US ABDOMEN LIMITED
1 series · 14 of 25 positions shown · non-contrast
Comparison: None.

CLINICAL DATA: Elevated LFTs.

EXAM:
ULTRASOUND ABDOMEN LIMITED RIGHT UPPER QUADRANT

[Series 1: us abdomen limited ruq (liver/gb) · 14 of 48 slices shown]
[im 1/48]
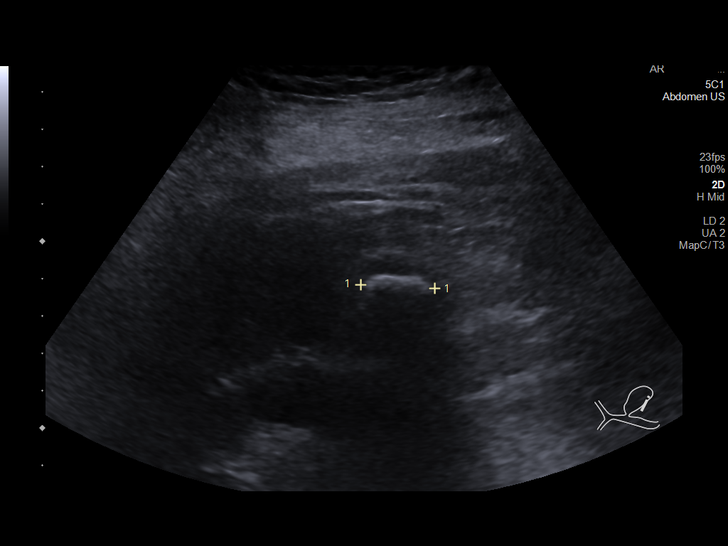
[im 4/48]
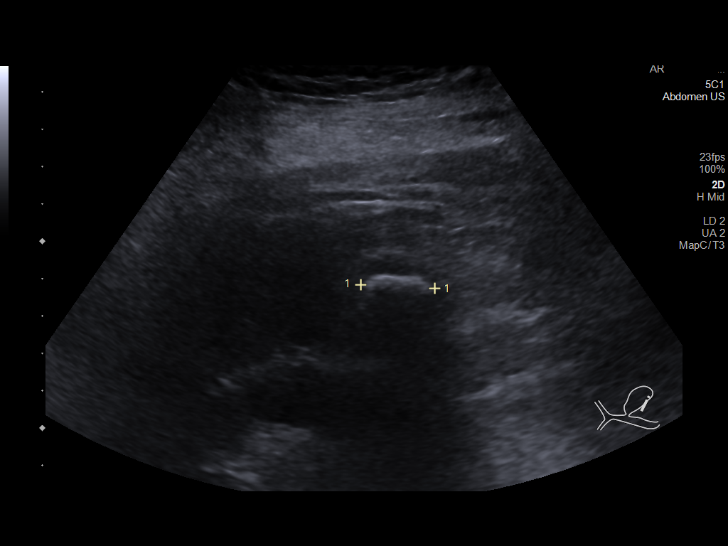
[im 8/48]
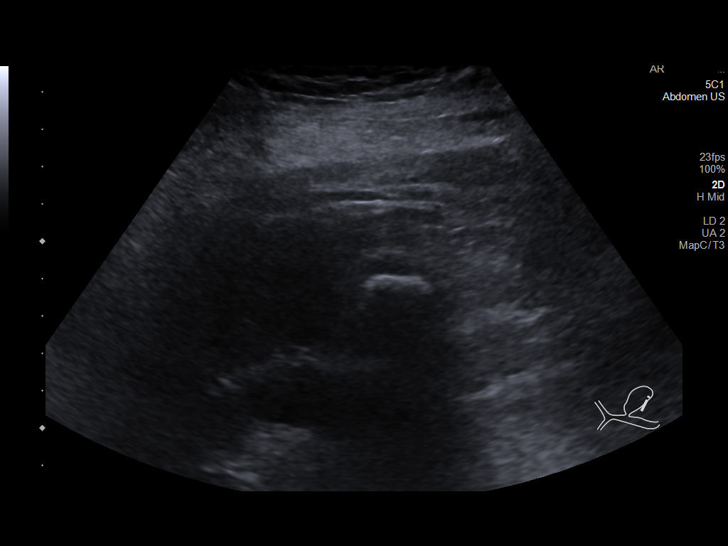
[im 12/48]
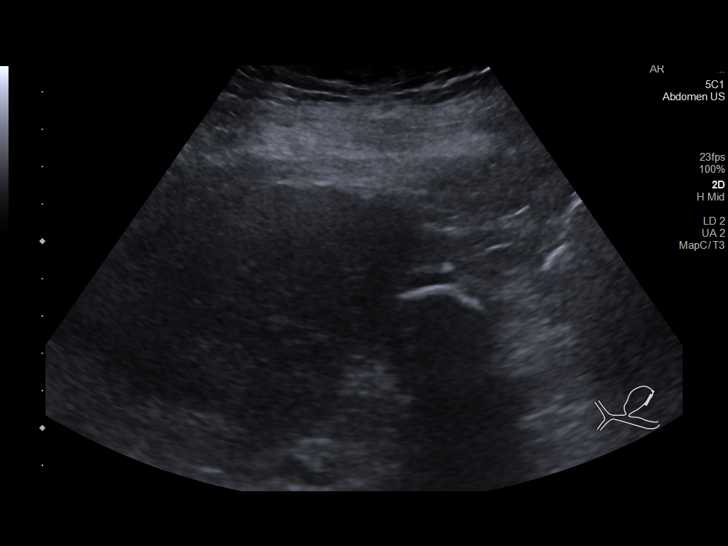
[im 16/48]
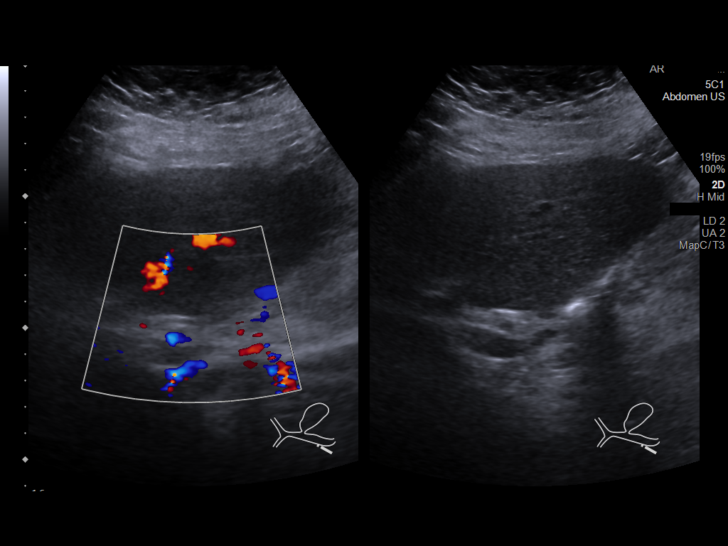
[im 18/48]
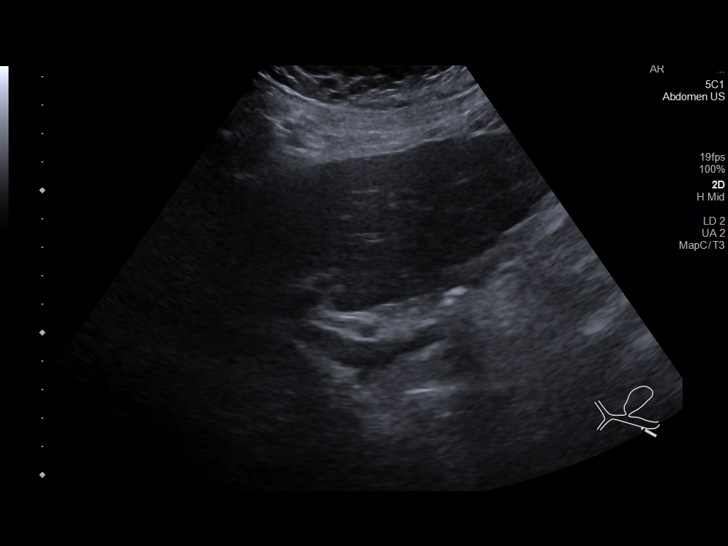
[im 22/48]
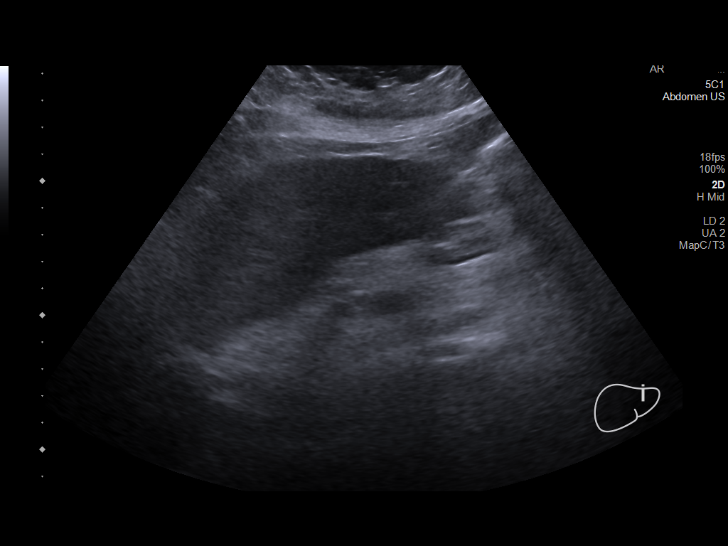
[im 26/48]
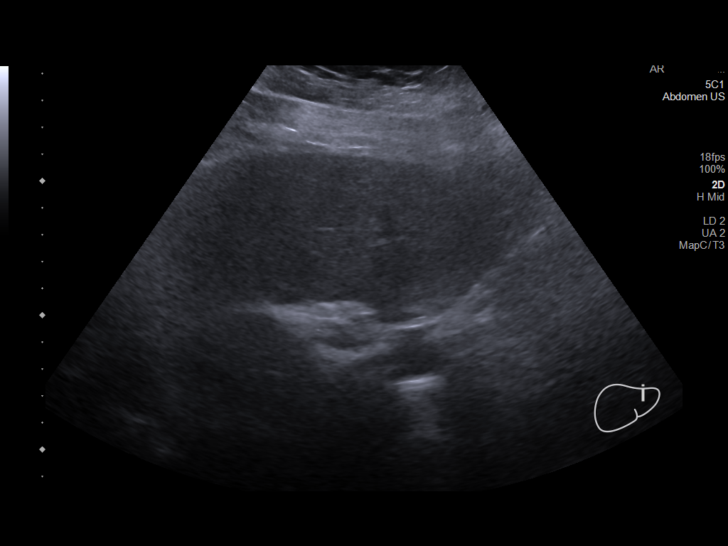
[im 30/48]
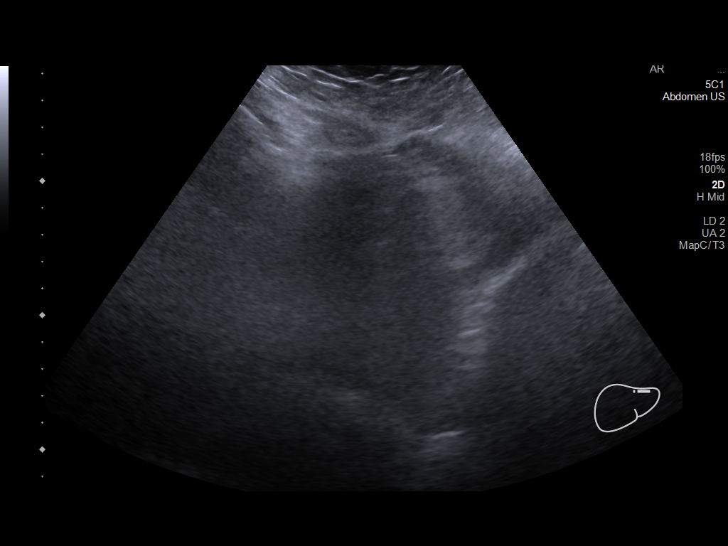
[im 32/48]
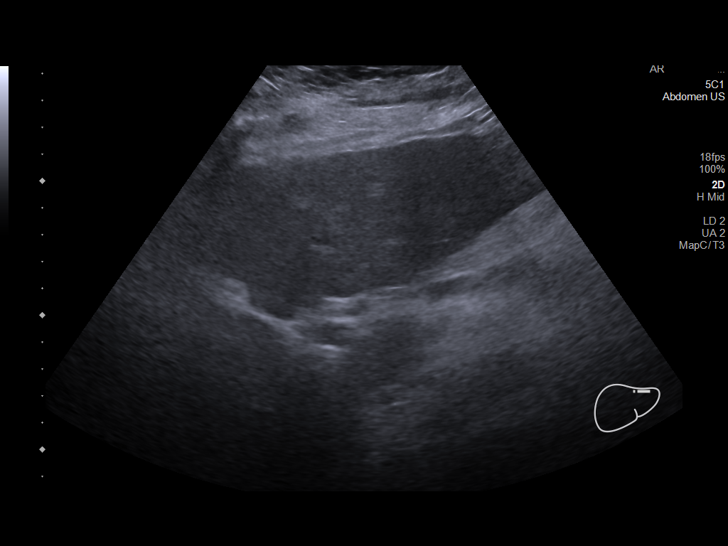
[im 36/48]
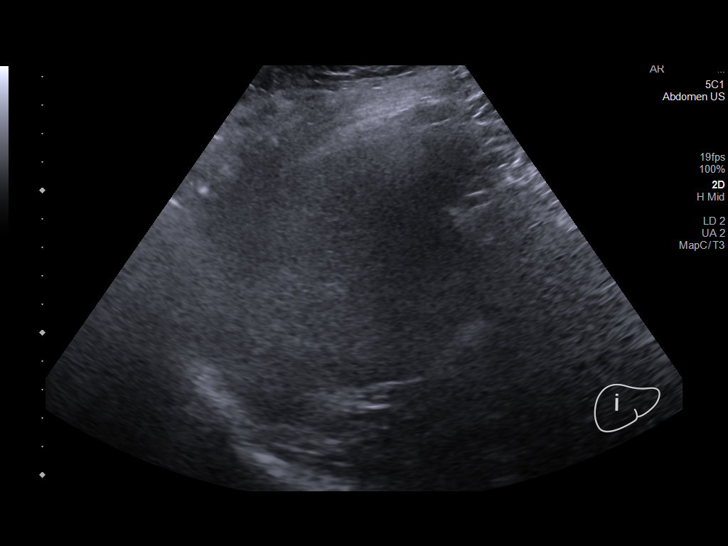
[im 40/48]
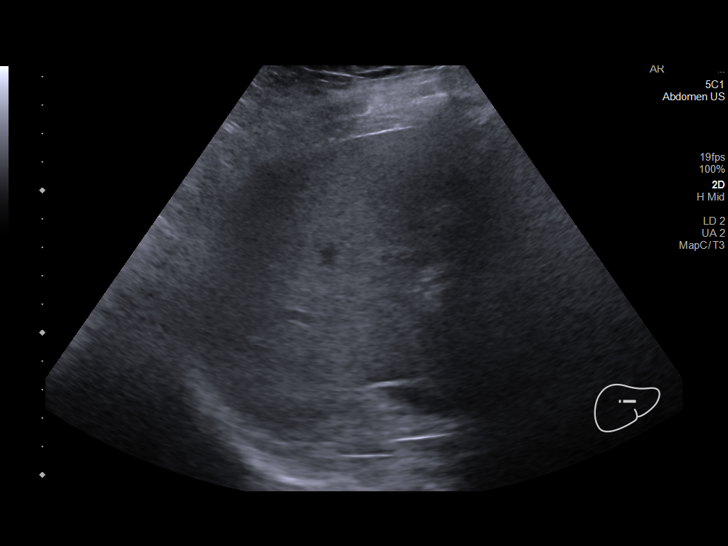
[im 44/48]
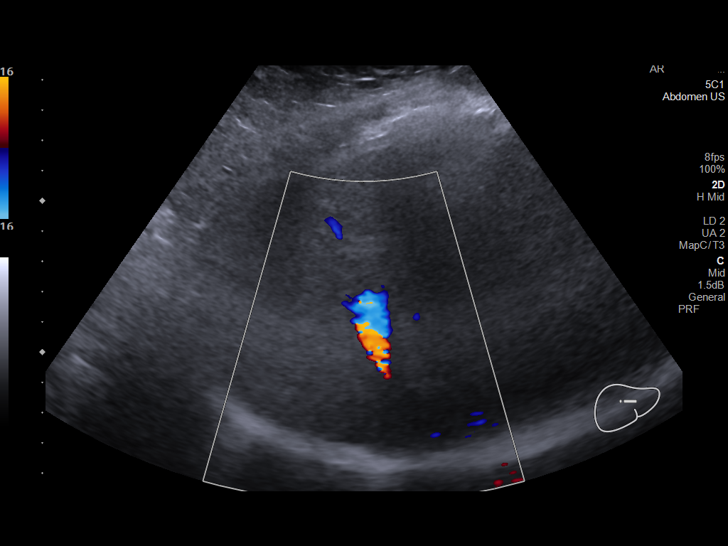
[im 48/48]
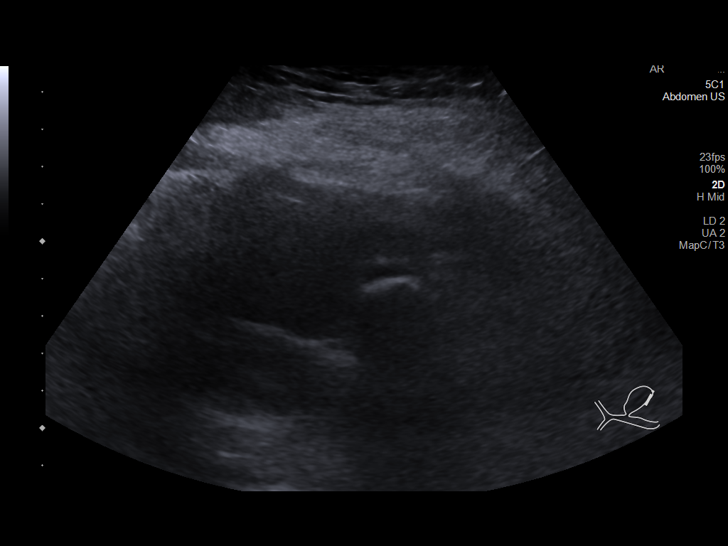

[14 of 25 positions shown; findings below may reference images not displayed]

FINDINGS: Gallbladder:

No gallstones or gallbladder wall thickening. No pericholecystic
fluid. The sonographer reports no sonographic Murphy's sign.

Common bile duct:

Diameter: 11 mm

Liver:

Liver parenchyma is echogenic and heterogeneous. Portal vein is
patent on color Doppler imaging with normal direction of blood flow
towards the liver.

Other: None.
IMPRESSION: Extrahepatic biliary duct dilatation. MR BARON/MRCP could be used to
further evaluate as clinically warranted.

Heterogeneous echogenic liver, potentially related to steatosis.

## 2021-12-08 IMAGING — CT CT ANGIO CHEST
2 of 6 series · 19 of 46 positions shown · IV contrast (Omnipaque or Isovue)
Comparison: None

CLINICAL DATA: Concern for pulmonary embolism. Positive D-dimer.
Positive [id number].

EXAM:
CT ANGIOGRAPHY CHEST WITH CONTRAST
TECHNIQUE: Multidetector CT imaging of the chest was performed using the
standard protocol during bolus administration of intravenous
contrast. Multiplanar CT image reconstructions and MIPs were
obtained to evaluate the vascular anatomy.
CONTRAST:  100mL OMNIPAQUE IOHEXOL 350 MG/ML SOLN

[Series 5: pe axial thins · axial · 0.58mm/px · z∈[+1255,+1529]mm · 16 of 302 slices shown]
[im 14/302  lung]
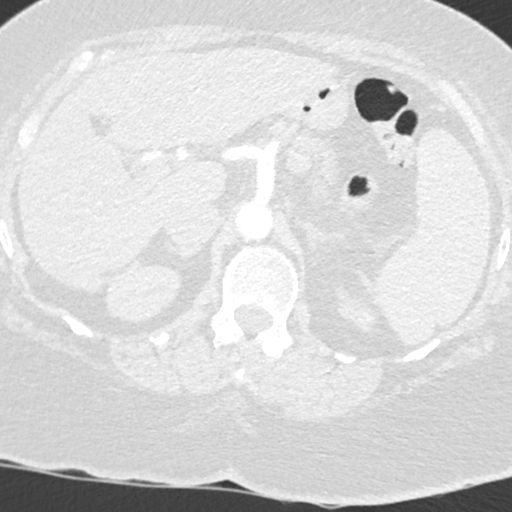
[im 40/302  soft-tissue]
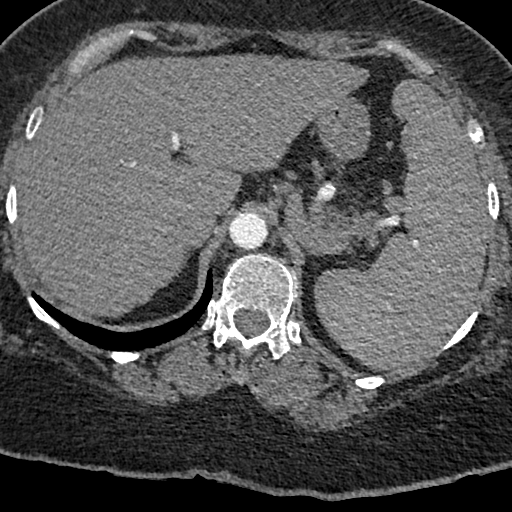
[im 53/302  lung]
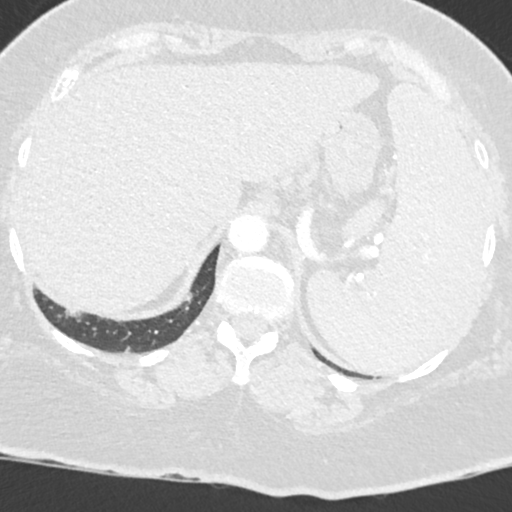
[im 66/302  soft-tissue]
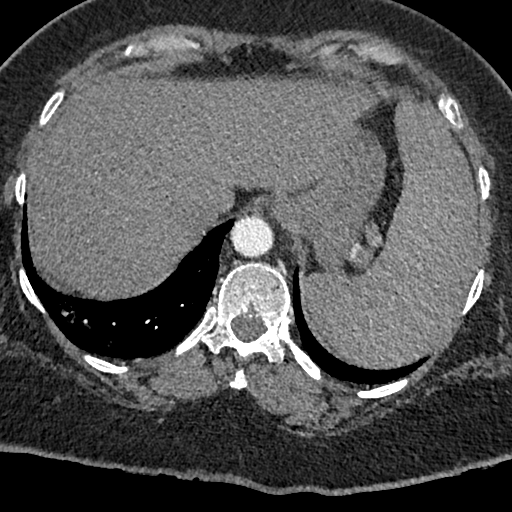
[im 92/302  lung]
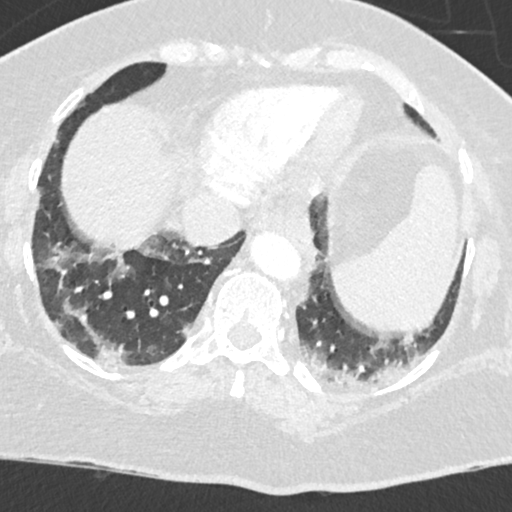
[im 105/302  soft-tissue]
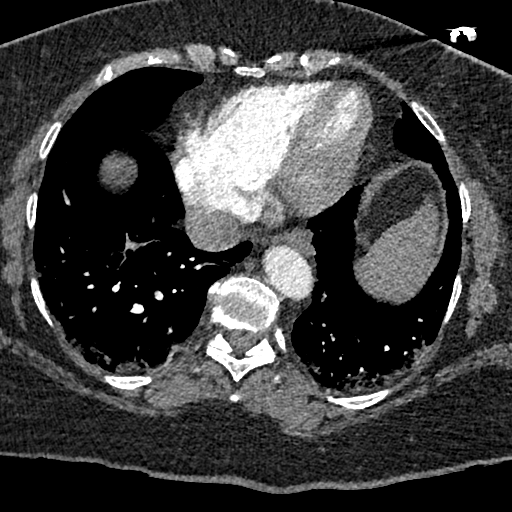
[im 118/302  lung]
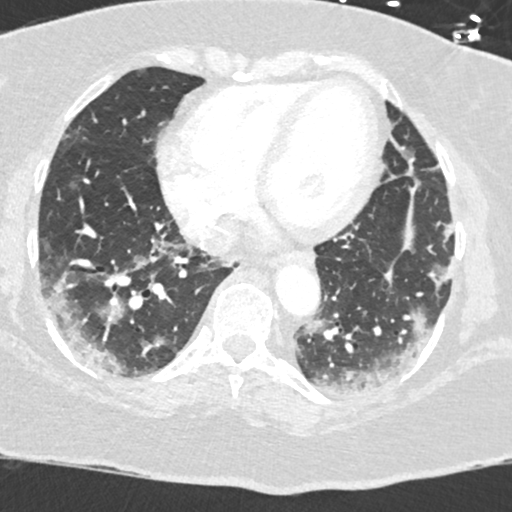
[im 144/302  soft-tissue]
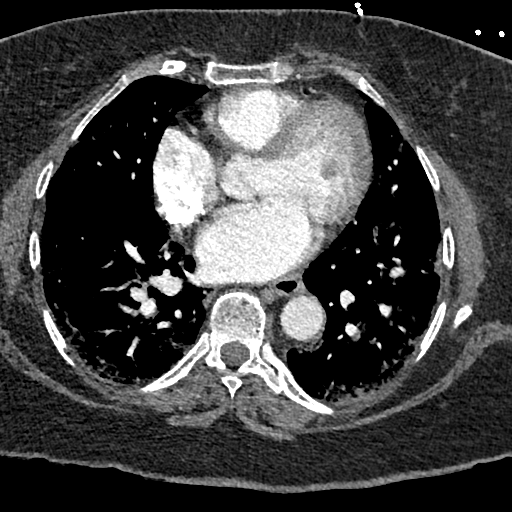
[im 158/302  lung]
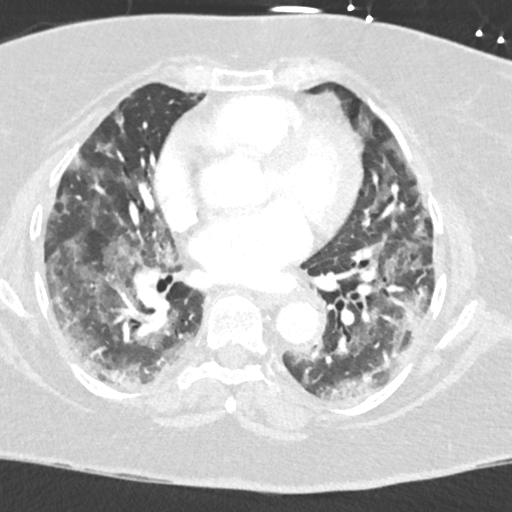
[im 184/302  soft-tissue]
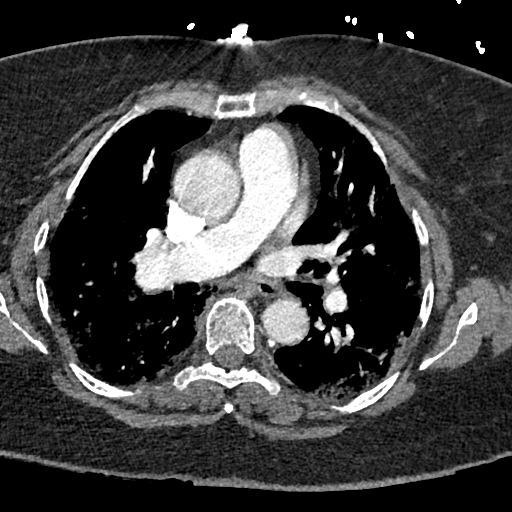
[im 197/302  lung]
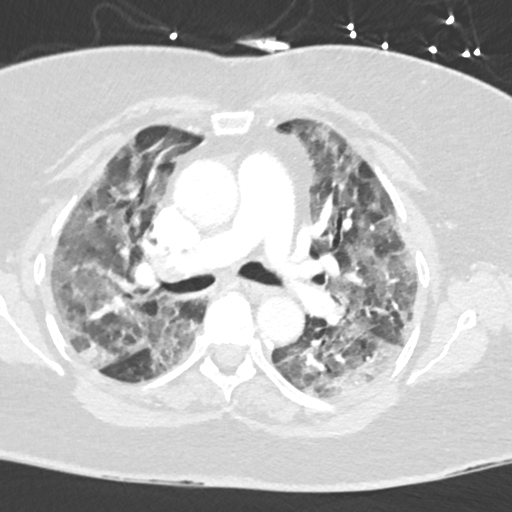
[im 210/302  soft-tissue]
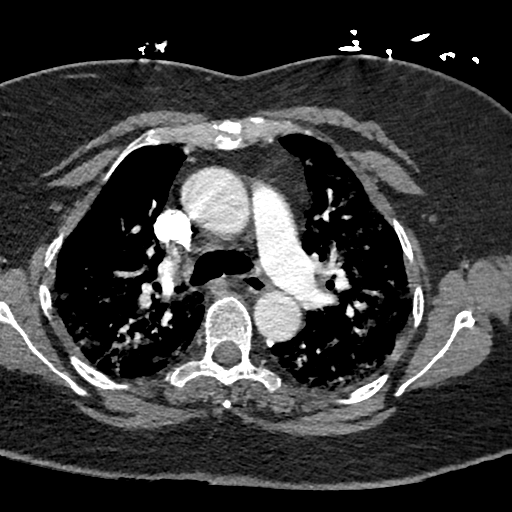
[im 236/302  lung]
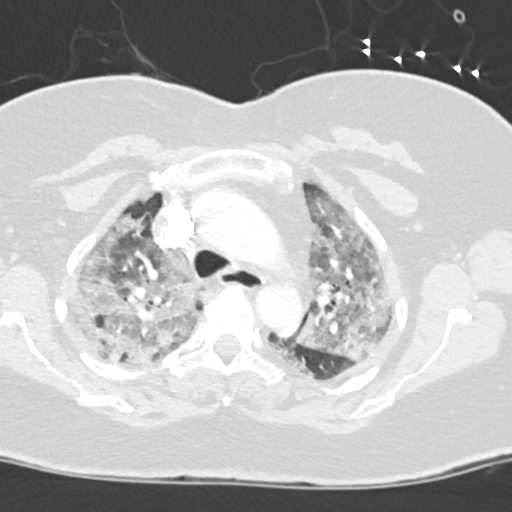
[im 249/302  soft-tissue]
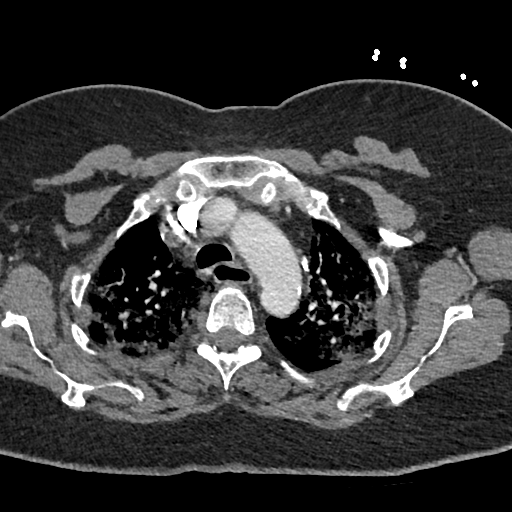
[im 262/302  lung]
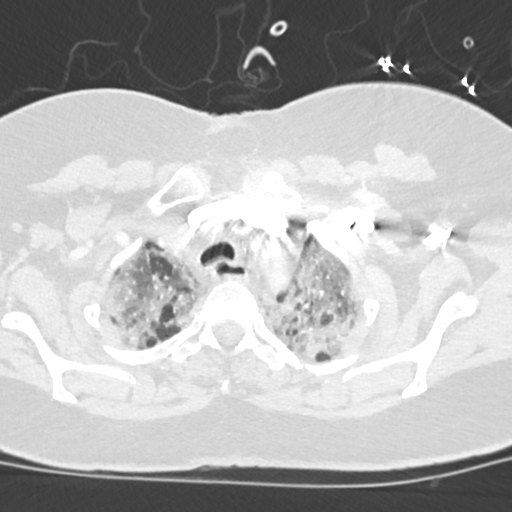
[im 288/302  soft-tissue]
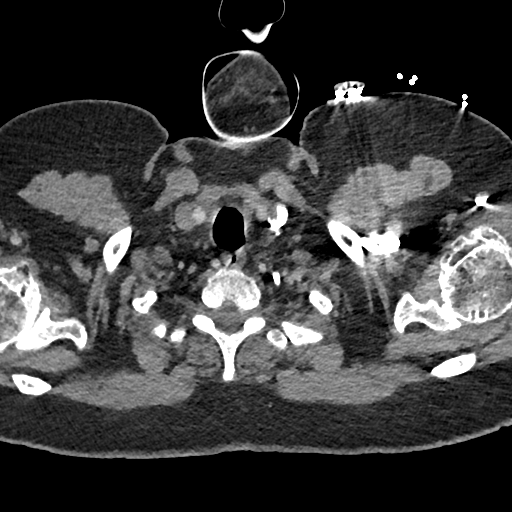

[Series 7: cor soft · coronal · 0.64mm/px · 3 of 155 slices shown]
[im 39/155  soft-tissue]
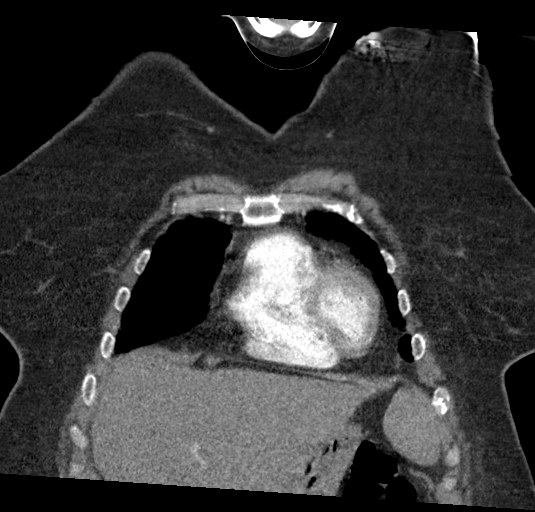
[im 78/155  soft-tissue]
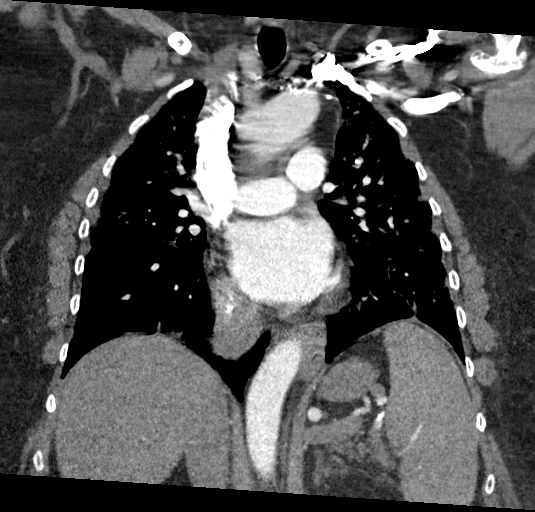
[im 116/155  soft-tissue]
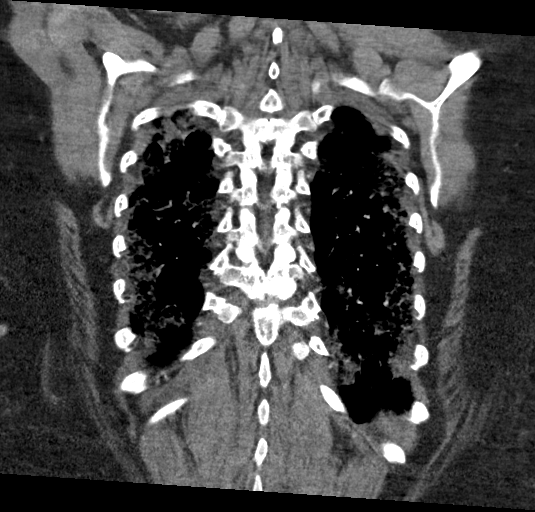

[19 of 46 positions shown; findings below may reference images not displayed]

FINDINGS: Cardiovascular: No filling defects within the pulmonary arteries to
suggest acute pulmonary embolism. No significant vascular findings.
Normal heart size. No pericardial effusion.

Mediastinum/Nodes: No axillary or supraclavicular adenopathy. No
mediastinal or hilar adenopathy. No pericardial fluid. Esophagus
normal.

Lungs/Pleura: Diffuse ground-glass airspace disease in the upper
lobes and peripheral lower lobes. No pneumothorax. No pleural fluid

Upper Abdomen: Limited view of the liver, kidneys, pancreas are
unremarkable. Normal adrenal glands.

Musculoskeletal: No aggressive osseous lesion

Review of the MIP images confirms the above findings.
IMPRESSION: 1. No evidence acute pulmonary embolism.
2. Diffuse bilateral ground-glass airspace disease consistent with
COVID pneumonia.

## 2023-08-12 ENCOUNTER — Other Ambulatory Visit (HOSPITAL_COMMUNITY): Payer: Self-pay | Admitting: Neurological Surgery

## 2023-08-12 DIAGNOSIS — M5441 Lumbago with sciatica, right side: Secondary | ICD-10-CM
